# Patient Record
Sex: Male | Born: 1975 | Race: White | Hispanic: No | Marital: Single | Smoking: Current every day smoker
Health system: Southern US, Community
[De-identification: ages and names within clinical notes are randomized; demographics above are authoritative.]

## PROBLEM LIST (undated history)

## (undated) DIAGNOSIS — S83519A Sprain of anterior cruciate ligament of unspecified knee, initial encounter: Secondary | ICD-10-CM

## (undated) DIAGNOSIS — M549 Dorsalgia, unspecified: Secondary | ICD-10-CM

## (undated) DIAGNOSIS — F329 Major depressive disorder, single episode, unspecified: Secondary | ICD-10-CM

## (undated) DIAGNOSIS — S82899A Other fracture of unspecified lower leg, initial encounter for closed fracture: Secondary | ICD-10-CM

## (undated) DIAGNOSIS — R0600 Dyspnea, unspecified: Secondary | ICD-10-CM

## (undated) DIAGNOSIS — F319 Bipolar disorder, unspecified: Secondary | ICD-10-CM

## (undated) DIAGNOSIS — G8929 Other chronic pain: Secondary | ICD-10-CM

## (undated) DIAGNOSIS — R7303 Prediabetes: Secondary | ICD-10-CM

## (undated) DIAGNOSIS — F32A Depression, unspecified: Secondary | ICD-10-CM

## (undated) DIAGNOSIS — F209 Schizophrenia, unspecified: Secondary | ICD-10-CM

## (undated) DIAGNOSIS — F419 Anxiety disorder, unspecified: Secondary | ICD-10-CM

## (undated) DIAGNOSIS — Z87442 Personal history of urinary calculi: Secondary | ICD-10-CM

## (undated) HISTORY — PX: KNEE SURGERY: SHX244

## (undated) HISTORY — PX: MULTIPLE TOOTH EXTRACTIONS: SHX2053

## (undated) HISTORY — PX: VASECTOMY: SHX75

## (undated) HISTORY — DX: Other fracture of unspecified lower leg, initial encounter for closed fracture: S82.899A

---

## 2001-08-09 ENCOUNTER — Emergency Department (HOSPITAL_COMMUNITY): Admission: EM | Admit: 2001-08-09 | Discharge: 2001-08-09 | Payer: Self-pay | Admitting: Emergency Medicine

## 2001-08-09 ENCOUNTER — Encounter: Payer: Self-pay | Admitting: Emergency Medicine

## 2001-08-20 ENCOUNTER — Encounter (HOSPITAL_COMMUNITY): Admission: RE | Admit: 2001-08-20 | Discharge: 2001-09-19 | Payer: Self-pay | Admitting: Preventative Medicine

## 2001-12-08 ENCOUNTER — Emergency Department (HOSPITAL_COMMUNITY): Admission: EM | Admit: 2001-12-08 | Discharge: 2001-12-08 | Payer: Self-pay | Admitting: *Deleted

## 2002-07-23 ENCOUNTER — Emergency Department (HOSPITAL_COMMUNITY): Admission: EM | Admit: 2002-07-23 | Discharge: 2002-07-23 | Payer: Self-pay | Admitting: *Deleted

## 2003-04-21 ENCOUNTER — Emergency Department (HOSPITAL_COMMUNITY): Admission: EM | Admit: 2003-04-21 | Discharge: 2003-04-21 | Payer: Self-pay | Admitting: Emergency Medicine

## 2003-04-29 ENCOUNTER — Ambulatory Visit (HOSPITAL_COMMUNITY): Admission: RE | Admit: 2003-04-29 | Discharge: 2003-04-29 | Payer: Self-pay | Admitting: Orthopedic Surgery

## 2005-05-27 ENCOUNTER — Ambulatory Visit (HOSPITAL_COMMUNITY): Admission: RE | Admit: 2005-05-27 | Discharge: 2005-05-27 | Payer: Self-pay | Admitting: Family Medicine

## 2008-05-14 ENCOUNTER — Emergency Department (HOSPITAL_COMMUNITY): Admission: EM | Admit: 2008-05-14 | Discharge: 2008-05-14 | Payer: Self-pay | Admitting: Emergency Medicine

## 2008-11-16 ENCOUNTER — Emergency Department (HOSPITAL_COMMUNITY): Admission: EM | Admit: 2008-11-16 | Discharge: 2008-11-16 | Payer: Self-pay | Admitting: Emergency Medicine

## 2009-02-05 ENCOUNTER — Emergency Department (HOSPITAL_COMMUNITY): Admission: EM | Admit: 2009-02-05 | Discharge: 2009-02-05 | Payer: Self-pay | Admitting: Emergency Medicine

## 2009-04-29 ENCOUNTER — Emergency Department (HOSPITAL_COMMUNITY): Admission: EM | Admit: 2009-04-29 | Discharge: 2009-04-29 | Payer: Self-pay | Admitting: Emergency Medicine

## 2009-05-03 ENCOUNTER — Emergency Department (HOSPITAL_COMMUNITY): Admission: EM | Admit: 2009-05-03 | Discharge: 2009-05-04 | Payer: Self-pay | Admitting: Emergency Medicine

## 2009-08-25 ENCOUNTER — Emergency Department (HOSPITAL_COMMUNITY): Admission: EM | Admit: 2009-08-25 | Discharge: 2009-08-26 | Payer: Self-pay | Admitting: Emergency Medicine

## 2009-11-09 ENCOUNTER — Emergency Department (HOSPITAL_COMMUNITY): Admission: EM | Admit: 2009-11-09 | Discharge: 2009-11-09 | Payer: Self-pay | Admitting: Family Medicine

## 2009-12-24 ENCOUNTER — Emergency Department (HOSPITAL_COMMUNITY): Admission: EM | Admit: 2009-12-24 | Discharge: 2009-12-13 | Payer: Self-pay | Admitting: Emergency Medicine

## 2010-04-22 LAB — POCT CARDIAC MARKERS
Myoglobin, poc: 60.9 ng/mL (ref 12–200)
Troponin i, poc: <0.05 ng/mL (ref 0.00–0.09)

## 2010-04-22 LAB — BASIC METABOLIC PANEL
BUN: 16 mg/dL (ref 6–23)
CO2: 28 mEq/L (ref 19–32)
Chloride: 107 mEq/L (ref 96–112)
Creatinine, Ser: 0.75 mg/dL (ref 0.4–1.5)
GFR calc Af Amer: >60 mL/min (ref >60)
Potassium: 3.6 mEq/L (ref 3.5–5.1)

## 2010-04-28 LAB — URINALYSIS, ROUTINE W REFLEX MICROSCOPIC
Bilirubin Urine: NEGATIVE
Glucose, UA: NEGATIVE mg/dL
Ketones, ur: NEGATIVE mg/dL
Leukocytes, UA: NEGATIVE
Nitrite: NEGATIVE
Specific Gravity, Urine: >1.03 — ABNORMAL HIGH (ref 1.005–1.030)
Urobilinogen, UA: 0.2 mg/dL (ref 0.0–1.0)
pH: 6 (ref 5.0–8.0)

## 2010-04-28 LAB — BASIC METABOLIC PANEL WITH GFR
BUN: 15 mg/dL (ref 6–23)
CO2: 25 meq/L (ref 19–32)
Calcium: 9.6 mg/dL (ref 8.4–10.5)
Chloride: 106 meq/L (ref 96–112)
Creatinine, Ser: 0.88 mg/dL (ref 0.4–1.5)
GFR calc non Af Amer: >60 mL/min
Glucose, Bld: 137 mg/dL — ABNORMAL HIGH (ref 70–99)
Potassium: 2.9 meq/L — ABNORMAL LOW (ref 3.5–5.1)
Sodium: 137 meq/L (ref 135–145)

## 2010-04-28 LAB — CBC
HCT: 46.7 % (ref 39.0–52.0)
Hemoglobin: 16 g/dL (ref 13.0–17.0)
MCHC: 34.3 g/dL (ref 30.0–36.0)
MCV: 92.5 fL (ref 78.0–100.0)
Platelets: 277 K/uL (ref 150–400)
RBC: 5.05 MIL/uL (ref 4.22–5.81)
RDW: 13.3 % (ref 11.5–15.5)
WBC: 15.7 K/uL — ABNORMAL HIGH (ref 4.0–10.5)

## 2010-04-28 LAB — DIFFERENTIAL
Basophils Absolute: 0.1 10*3/uL (ref 0.0–0.1)
Basophils Relative: 0 % (ref 0–1)
Lymphocytes Relative: 42 % (ref 12–46)
Monocytes Absolute: 1 10*3/uL (ref 0.1–1.0)
Neutro Abs: 7.7 10*3/uL (ref 1.7–7.7)
Neutrophils Relative %: 49 % (ref 43–77)

## 2010-04-28 LAB — URINE MICROSCOPIC-ADD ON

## 2011-04-16 ENCOUNTER — Encounter (HOSPITAL_COMMUNITY): Payer: Self-pay | Admitting: *Deleted

## 2011-04-16 ENCOUNTER — Emergency Department (HOSPITAL_COMMUNITY): Payer: Medicaid Other

## 2011-04-16 ENCOUNTER — Emergency Department (HOSPITAL_COMMUNITY)
Admission: EM | Admit: 2011-04-16 | Discharge: 2011-04-16 | Disposition: A | Discharge status: Home / Self Care | Payer: Medicaid Other | Attending: Emergency Medicine | Admitting: Emergency Medicine

## 2011-04-16 DIAGNOSIS — R059 Cough, unspecified: Secondary | ICD-10-CM | POA: Insufficient documentation

## 2011-04-16 DIAGNOSIS — F319 Bipolar disorder, unspecified: Secondary | ICD-10-CM | POA: Insufficient documentation

## 2011-04-16 DIAGNOSIS — F17200 Nicotine dependence, unspecified, uncomplicated: Secondary | ICD-10-CM | POA: Insufficient documentation

## 2011-04-16 DIAGNOSIS — J209 Acute bronchitis, unspecified: Secondary | ICD-10-CM

## 2011-04-16 DIAGNOSIS — R110 Nausea: Secondary | ICD-10-CM | POA: Insufficient documentation

## 2011-04-16 DIAGNOSIS — F419 Anxiety disorder, unspecified: Secondary | ICD-10-CM

## 2011-04-16 DIAGNOSIS — R05 Cough: Secondary | ICD-10-CM | POA: Insufficient documentation

## 2011-04-16 HISTORY — DX: Depression, unspecified: F32.A

## 2011-04-16 HISTORY — DX: Major depressive disorder, single episode, unspecified: F32.9

## 2011-04-16 HISTORY — DX: Bipolar disorder, unspecified: F31.9

## 2011-04-16 MED ORDER — AZITHROMYCIN 250 MG PO TABS: 250.0000 mg | ORAL_TABLET | Freq: Every day | ORAL | Status: AC

## 2011-04-16 MED ORDER — LORAZEPAM 1 MG PO TABS: 1.0000 mg | ORAL_TABLET | Freq: Four times a day (QID) | ORAL | Status: AC | PRN

## 2011-04-16 NOTE — ED Provider Notes (Signed)
History   This chart was scribed for Geoffery Lyons, MD by Melba Coon. The patient was seen in room APA16A/APA16A and the patient's care was started at 1:03PM.    CSN: [id number]  Arrival date & time 04/16/11  1235   First MD Initiated Contact with Patient 04/16/11 1259      Chief Complaint  Patient presents with  . Nausea  . Cough    (Consider location/radiation/quality/duration/timing/severity/associated sxs/prior treatment) HPI Craig [name] is a 36 y.o. male who presents to the Emergency Department complaining of persistent moderate to severe nausea with associated productive cough with an onset 2 days ago. Productive cough has light brown phlegm. Around onset, pt states he left his wife the same day; no suicidal or homicidal ideation. Sore throat, decreased appetite, and disturbed sleep present. No HA, fever, neck pain, back pain, CP, SOB, abd pain, diarrhea, hematemesis, or extremity pain, weakness, numbness, or tingling. Pt has a Hx of depression and bipolar disorder and takes depression meds (lithium, Latuda) which does not help. Pt also takes Crestor for his cholesterol levels. Allergic to penicillins. No other pertinent medical symptoms.  Past Medical History  Diagnosis Date  . Bipolar 1 disorder   . Depression     History reviewed. No pertinent past surgical history.  History reviewed. No pertinent family history.  History  Substance Use Topics  . Smoking status: Current Everyday Smoker -- 1.0 packs/day    Types: Cigarettes  . Smokeless tobacco: Not on file  . Alcohol Use: No      Review of Systems 10 Systems reviewed and all are negative for acute change except as noted in the HPI.   Allergies  Penicillins  Home Medications  No current outpatient prescriptions on file.  BP 105/80  Pulse 94  Temp(Src) 97.3 F (36.3 C) (Oral)  Resp 20  Ht 5\' 5"  (1.651 m)  Wt 183 lb (83.008 kg)  BMI 30.45 kg/m2  SpO2 100%  Physical Exam  Nursing note and  vitals reviewed. Constitutional: He is oriented to person, place, and time. He appears well-developed and well-nourished.       Awake, alert, nontoxic appearance.  HENT:  Head: Normocephalic and atraumatic.  Eyes: EOM are normal. Pupils are equal, round, and reactive to light. Right eye exhibits no discharge. Left eye exhibits no discharge.  Neck: Normal range of motion. Neck supple.  Cardiovascular: Normal rate, regular rhythm and normal heart sounds.   No murmur heard. Pulmonary/Chest: Effort normal and breath sounds normal. He exhibits no tenderness.  Abdominal: Soft. There is no tenderness. There is no rebound and no guarding.  Musculoskeletal: Normal range of motion. He exhibits no tenderness.       Baseline ROM, no obvious new focal weakness.  Neurological: He is alert and oriented to person, place, and time.       Mental status and motor strength appears baseline for patient and situation.  Skin: Skin is warm and dry. No rash noted.  Psychiatric: He has a normal mood and affect. His behavior is normal.    ED Course  Procedures (including critical care time)  DIAGNOSTIC STUDIES: Oxygen Saturation is 100% on room air, normal by my interpretation.    COORDINATION OF CARE:  1:19PM - EDMD will order abd CT for the pt.   Labs Reviewed - No data to display No results found.   No diagnosis found.    MDM  This appears to be bronchitis and will treat with zmax.  He admits to feeling  depressed but denies SI or HI.  He was offered consultation with act team which he declines.  He wants something for his nerves.    I personally performed the services described in this documentation, which was scribed in my presence. The recorded information has been reviewed and considered.          Geoffery Lyons, MD 04/16/11 [phone]

## 2011-04-16 NOTE — Discharge Instructions (Signed)

## 2011-04-16 NOTE — ED Notes (Signed)
For past 2 days pt c/o nausea and cough, also admits to leaving wife, denies SI or HI, hx of bipolar dx

## 2011-07-14 ENCOUNTER — Emergency Department (HOSPITAL_COMMUNITY)
Admission: EM | Admit: 2011-07-14 | Discharge: 2011-07-14 | Disposition: A | Discharge status: Home / Self Care | Payer: Medicaid Other | Attending: Emergency Medicine | Admitting: Emergency Medicine

## 2011-07-14 ENCOUNTER — Encounter (HOSPITAL_COMMUNITY): Payer: Self-pay | Admitting: *Deleted

## 2011-07-14 DIAGNOSIS — F319 Bipolar disorder, unspecified: Secondary | ICD-10-CM | POA: Insufficient documentation

## 2011-07-14 DIAGNOSIS — F411 Generalized anxiety disorder: Secondary | ICD-10-CM | POA: Insufficient documentation

## 2011-07-14 DIAGNOSIS — S60569A Insect bite (nonvenomous) of unspecified hand, initial encounter: Secondary | ICD-10-CM | POA: Insufficient documentation

## 2011-07-14 DIAGNOSIS — F17200 Nicotine dependence, unspecified, uncomplicated: Secondary | ICD-10-CM | POA: Insufficient documentation

## 2011-07-14 DIAGNOSIS — Z880 Allergy status to penicillin: Secondary | ICD-10-CM | POA: Insufficient documentation

## 2011-07-14 DIAGNOSIS — F209 Schizophrenia, unspecified: Secondary | ICD-10-CM | POA: Insufficient documentation

## 2011-07-14 DIAGNOSIS — W57XXXA Bitten or stung by nonvenomous insect and other nonvenomous arthropods, initial encounter: Secondary | ICD-10-CM

## 2011-07-14 HISTORY — DX: Anxiety disorder, unspecified: F41.9

## 2011-07-14 HISTORY — DX: Schizophrenia, unspecified: F20.9

## 2011-07-14 MED ORDER — FAMOTIDINE 20 MG PO TABS
20.0000 mg | ORAL_TABLET | Freq: Once | ORAL | Status: AC
  Administered 2011-07-14: 20 mg via ORAL
  Filled 2011-07-14: qty 1

## 2011-07-14 MED ORDER — TETANUS-DIPHTH-ACELL PERTUSSIS 5-2.5-18.5 LF-MCG/0.5 IM SUSP
0.5000 mL | Freq: Once | INTRAMUSCULAR | Status: AC
  Administered 2011-07-14: 0.5 mL via INTRAMUSCULAR
  Filled 2011-07-14: qty 0.5

## 2011-07-14 MED ORDER — HYDROCODONE-ACETAMINOPHEN 5-325 MG PO TABS
1.0000 | ORAL_TABLET | Freq: Once | ORAL | Status: AC
  Administered 2011-07-14: 1 via ORAL
  Filled 2011-07-14: qty 1

## 2011-07-14 MED ORDER — PREDNISONE 20 MG PO TABS
60.0000 mg | ORAL_TABLET | Freq: Once | ORAL | Status: AC
  Administered 2011-07-14: 60 mg via ORAL
  Filled 2011-07-14: qty 3

## 2011-07-14 MED ORDER — DIPHENHYDRAMINE HCL 25 MG PO CAPS
25.0000 mg | ORAL_CAPSULE | Freq: Once | ORAL | Status: AC
  Administered 2011-07-14: 25 mg via ORAL
  Filled 2011-07-14: qty 1

## 2011-07-14 NOTE — ED Notes (Signed)
Pt stated "I crawled under my trailer to get my dog, put my hand down on some dirt and felt something bite the heel of my left hand". Pt did not see what bit/stung him. No evident entry mark, lt hand is swollen and red from behind thumb extending to wrist.

## 2011-07-14 NOTE — ED Notes (Signed)
Discharge instructions reviewed with pt, questions answered. Pt verbalized understanding.  

## 2011-07-14 NOTE — Discharge Instructions (Signed)
Keep the hand elevated as much as possible. You may use benadryl for itching. Apply ice for swelling. Use tylenol or ibuprofen for discomfort.    Insect Bite Mosquitoes, flies, fleas, bedbugs, and other insects can bite. Insect bites are different from insect stings. The bite may be red, puffy (swollen), and itchy for 2 to 4 days. Most bites get better on their own. HOME CARE   Do not scratch the bite.   Keep the bite clean and dry. Wash the bite with soap and water.   Put ice on the bite.   Put ice in a plastic bag.   Place a towel between your skin and the bag.   Leave the ice on for 20 minutes, 4 times a day. Do this for the first 2 to 3 days, or as told by your doctor.   You may use medicated lotions or creams to lessen itching as told by your doctor.   Only take medicines as told by your doctor.   If you are given medicines (antibiotics), take them as told. Finish them even if you start to feel better.  You may need a tetanus shot if:  You cannot remember when you had your last tetanus shot.   You have never had a tetanus shot.   The injury broke your skin.  If you need a tetanus shot and you choose not to have one, you may get tetanus. Sickness from tetanus can be serious. GET HELP RIGHT AWAY IF:   You have more pain, redness, or puffiness.   You see a red line on the skin coming from the bite.   You have a fever.   You have joint pain.   You have a headache or neck pain.   You feel weak.   You have a rash.   You have chest pain, or you are short of breath.   You have belly (abdominal) pain.   You feel sick to your stomach (nauseous) or throw up (vomit).   You feel very tired or sleepy.  MAKE SURE YOU:   Understand these instructions.   Will watch your condition.   Will get help right away if you are not doing well or get worse.  Document Released: 01/01/2000 Document Revised: 12/23/2010 Document Reviewed: 08/04/2010 [facility] Patient Information  2012 [place], [place].

## 2011-07-14 NOTE — ED Provider Notes (Signed)
History     CSN: [id number]  Arrival date & time 07/14/11  0203   First MD Initiated Contact with Patient 07/14/11 0221      Chief Complaint  Patient presents with  . Insect Bite    (Consider location/radiation/quality/duration/timing/severity/associated sxs/prior treatment) HPI  Craig [name] is a 36 y.o. male who presents to the Emergency Department complaining of an insect bite to his left hand he sustained when he crawled under his trailer to get his dog this evening. He felt the sting but did not see the insect. Now with pain to the base of his thumb extending to his wrist.He has taken no medicines.  PCP Dr. Janna Arch  Past Medical History  Diagnosis Date  . Bipolar 1 disorder   . Depression   . Schizophrenia   . Anxiety     History reviewed. No pertinent past surgical history.  History reviewed. No pertinent family history.  History  Substance Use Topics  . Smoking status: Current Everyday Smoker -- 1.0 packs/day    Types: Cigarettes  . Smokeless tobacco: Not on file  . Alcohol Use: No      Review of Systems  Constitutional: Negative for fever.       10 Systems reviewed and are negative for acute change except as noted in the HPI.  HENT: Negative for congestion.   Eyes: Negative for discharge and redness.  Respiratory: Negative for cough and shortness of breath.   Cardiovascular: Negative for chest pain.  Gastrointestinal: Negative for vomiting and abdominal pain.  Musculoskeletal: Negative for back pain.       Hand pain  Skin: Negative for rash.  Neurological: Negative for syncope, numbness and headaches.  Psychiatric/Behavioral:       No behavior change.    Allergies  Penicillins  Home Medications   Current Outpatient Rx  Name Route Sig Dispense Refill  . BUSPAR PO Oral Take 1 tablet by mouth daily. Unknown strength    . LITHIUM CARBONATE 300 MG PO CAPS Oral Take 600 mg by mouth 2 (two) times daily.    [name] LATUDA PO Oral Take 1 tablet by  mouth daily. Unknown strength    . CRESTOR PO Oral Take 1 tablet by mouth daily. Unknown strength      BP 124/87  Pulse 84  Temp 98.3 F (36.8 C) (Oral)  Resp 17  Ht 5\' 5"  (1.651 m)  Wt 180 lb (81.647 kg)  BMI 29.95 kg/m2  SpO2 96%  Physical Exam  Nursing note and vitals reviewed. Constitutional:       Awake, alert, nontoxic appearance.  HENT:  Head: Atraumatic.  Eyes: Right eye exhibits no discharge. Left eye exhibits no discharge.  Neck: Neck supple.  Pulmonary/Chest: Effort normal. He exhibits no tenderness.  Abdominal: Soft. There is no tenderness. There is no rebound.  Musculoskeletal: He exhibits no tenderness.       Baseline ROM, no obvious new focal weakness.  Neurological:       Mental status and motor strength appears baseline for patient and situation.  Skin: No rash noted.       Left hand with mild erythema and swelling to thumb extending to wrist. Tender to touch. No obvious bite mark or puncture wound.Pulses 2+, cap refill brisk.  Psychiatric: He has a normal mood and affect.    ED Course  Procedures (including critical care time)      MDM  Patient with an insect bite to the left hand now with erythema and swelling.  Given benadryl, pepcid, prednisone, tetanus.Pt stable in ED with no significant deterioration in condition.The patient appears reasonably screened and/or stabilized for discharge and I doubt any other medical condition or other [facility] requiring further screening, evaluation, or treatment in the ED at this time prior to discharge.  MDM Reviewed: nursing note and vitals           Nicoletta Dress. Colon Cadle, MD 07/14/11 [phone]

## 2012-06-13 ENCOUNTER — Other Ambulatory Visit (HOSPITAL_COMMUNITY)
Admission: RE | Admit: 2012-06-13 | Discharge: 2012-06-13 | Disposition: A | Discharge status: Home / Self Care | Payer: Medicare Other | Source: Ambulatory Visit | Attending: Urology | Admitting: Urology

## 2012-06-13 DIAGNOSIS — Z9852 Vasectomy status: Secondary | ICD-10-CM | POA: Insufficient documentation

## 2012-07-06 ENCOUNTER — Encounter (HOSPITAL_COMMUNITY): Payer: Self-pay | Admitting: *Deleted

## 2012-07-06 ENCOUNTER — Emergency Department (HOSPITAL_COMMUNITY): Payer: Medicare Other

## 2012-07-06 ENCOUNTER — Emergency Department (HOSPITAL_COMMUNITY)
Admission: EM | Admit: 2012-07-06 | Discharge: 2012-07-06 | Disposition: A | Discharge status: Home / Self Care | Payer: Medicare Other | Attending: Emergency Medicine | Admitting: Emergency Medicine

## 2012-07-06 DIAGNOSIS — H53149 Visual discomfort, unspecified: Secondary | ICD-10-CM | POA: Insufficient documentation

## 2012-07-06 DIAGNOSIS — M5380 Other specified dorsopathies, site unspecified: Secondary | ICD-10-CM | POA: Insufficient documentation

## 2012-07-06 DIAGNOSIS — Z79899 Other long term (current) drug therapy: Secondary | ICD-10-CM | POA: Insufficient documentation

## 2012-07-06 DIAGNOSIS — F17200 Nicotine dependence, unspecified, uncomplicated: Secondary | ICD-10-CM | POA: Insufficient documentation

## 2012-07-06 DIAGNOSIS — M62838 Other muscle spasm: Secondary | ICD-10-CM

## 2012-07-06 DIAGNOSIS — R079 Chest pain, unspecified: Secondary | ICD-10-CM | POA: Insufficient documentation

## 2012-07-06 DIAGNOSIS — F411 Generalized anxiety disorder: Secondary | ICD-10-CM | POA: Insufficient documentation

## 2012-07-06 DIAGNOSIS — F319 Bipolar disorder, unspecified: Secondary | ICD-10-CM | POA: Insufficient documentation

## 2012-07-06 DIAGNOSIS — Z8659 Personal history of other mental and behavioral disorders: Secondary | ICD-10-CM | POA: Insufficient documentation

## 2012-07-06 DIAGNOSIS — R51 Headache: Secondary | ICD-10-CM | POA: Insufficient documentation

## 2012-07-06 LAB — CBC WITH DIFFERENTIAL/PLATELET
Basophils Absolute: 0 10*3/uL (ref 0.0–0.1)
Basophils Relative: 0 % (ref 0–1)
Hemoglobin: 15.7 g/dL (ref 13.0–17.0)
Lymphocytes Relative: 30 % (ref 12–46)
MCHC: 34.4 g/dL (ref 30.0–36.0)
Monocytes Relative: 8 % (ref 3–12)
Neutro Abs: 6.8 10*3/uL (ref 1.7–7.7)
Neutrophils Relative %: 59 % (ref 43–77)
RBC: 5.01 MIL/uL (ref 4.22–5.81)
WBC: 11.4 10*3/uL — ABNORMAL HIGH (ref 4.0–10.5)

## 2012-07-06 LAB — BASIC METABOLIC PANEL
BUN: 14 mg/dL (ref 6–23)
CO2: 26 mEq/L (ref 19–32)
Chloride: 104 mEq/L (ref 96–112)
GFR calc Af Amer: >90 mL/min (ref >90)
Potassium: 3.6 mEq/L (ref 3.5–5.1)

## 2012-07-06 LAB — LITHIUM LEVEL: Lithium Lvl: <0.25 mEq/L — ABNORMAL LOW (ref 0.80–1.40)

## 2012-07-06 MED ORDER — SODIUM CHLORIDE 0.9 % IV SOLN
1000.0000 mL | INTRAVENOUS | Status: DC
  Administered 2012-07-06: 1000 mL via INTRAVENOUS

## 2012-07-06 MED ORDER — SODIUM CHLORIDE 0.9 % IV SOLN
1000.0000 mL | Freq: Once | INTRAVENOUS | Status: AC
  Administered 2012-07-06: 1000 mL via INTRAVENOUS

## 2012-07-06 MED ORDER — METOCLOPRAMIDE HCL 5 MG/ML IJ SOLN
10.0000 mg | Freq: Once | INTRAMUSCULAR | Status: AC
  Administered 2012-07-06: 10 mg via INTRAVENOUS
  Filled 2012-07-06: qty 2

## 2012-07-06 MED ORDER — KETOROLAC TROMETHAMINE 30 MG/ML IJ SOLN
30.0000 mg | Freq: Once | INTRAMUSCULAR | Status: AC
  Administered 2012-07-06: 30 mg via INTRAVENOUS
  Filled 2012-07-06: qty 1

## 2012-07-06 MED ORDER — DIPHENHYDRAMINE HCL 50 MG/ML IJ SOLN
25.0000 mg | Freq: Once | INTRAMUSCULAR | Status: AC
  Administered 2012-07-06: 25 mg via INTRAVENOUS
  Filled 2012-07-06: qty 1

## 2012-07-06 MED ORDER — MAGNESIUM SULFATE 40 MG/ML IJ SOLN
2.0000 g | Freq: Once | INTRAMUSCULAR | Status: AC
  Administered 2012-07-06: 2 g via INTRAVENOUS
  Filled 2012-07-06: qty 50

## 2012-07-06 MED ORDER — METHOCARBAMOL 500 MG PO TABS: 1000.0000 mg | ORAL_TABLET | Freq: Four times a day (QID) | ORAL | Status: DC | PRN

## 2012-07-06 NOTE — ED Notes (Signed)
Headache x 4 days. New onset. Denies hx of headaches. States dizziness at times.

## 2012-07-06 NOTE — ED Provider Notes (Signed)
History     CSN: [id number]  Arrival date & time 07/06/12  1743   First MD Initiated Contact with Patient 07/06/12 1807      Chief Complaint  Patient presents with  . Headache    (Consider location/radiation/quality/duration/timing/severity/associated sxs/prior treatment) HPI Patient reports about 4 days ago he woke up from sleep about 3 AM with pain that initially was in the front part of his head and then radiated to the back part of his head. He states the headache has been constant but waxes and wanes. He describes the headache as throbbing. He states nothing makes it feel worse and nothing makes it feel better, however he does admit to photophobia and noise sensitivity. He states he took a hydrocodone or Percocet and ibuprofen for pain without relief. He states he's never had a headache like this before. He denies blurred vision, nausea, vomiting, or fever. He states he feels lightheaded like he is going to pass out. He also relates he has had chest pain all day today that lasts 30-60 minutes at the time and he describes as a dull pain and sometimes sharp. He denies shortness of breath or sweatiness. He relates his current headache as an 8/10 and at its worst it was an 8/10. Pt reports he has been under stress b/o bills.   PCP Dr Loney Hering  Past Medical History  Diagnosis Date  . Bipolar 1 disorder   . Depression   . Schizophrenia   . Anxiety     History reviewed. No pertinent past surgical history.  No family history on file.  History  Substance Use Topics  . Smoking status: Current Every Day Smoker -- 1.00 packs/day    Types: Cigarettes  . Smokeless tobacco: Not on file  . Alcohol Use: No   Lives at home Lives with wife and daughters   Review of Systems  All other systems reviewed and are negative.    Allergies  Penicillins  Home Medications   Current Outpatient Rx  Name  Route  Sig  Dispense  Refill  . albuterol (PROAIR HFA) 108 (90 BASE) MCG/ACT inhaler  Inhalation   Inhale 2 puffs into the lungs 4 (four) times daily as needed for wheezing or shortness of breath.         . chlorproMAZINE (THORAZINE) 50 MG tablet   Oral   Take 50 mg by mouth 3 (three) times daily.         [name] HYDROcodone-acetaminophen (NORCO/VICODIN) 5-325 MG per tablet   Oral   Take 1 tablet by mouth every 6 (six) hours as needed for pain.         [name] lithium carbonate (LITHOBID) 300 MG CR tablet   Oral   Take 600 mg by mouth 2 (two) times daily.           BP 119/73  Pulse 64  Temp(Src) 98 F (36.7 C) (Oral)  Resp 16  Ht 5\' 5"  (1.651 m)  Wt 190 lb (86.183 kg)  BMI 31.62 kg/m2  SpO2 98%  Vital signs normal    Physical Exam  Nursing note and vitals reviewed. Constitutional: He is oriented to person, place, and time. He appears well-developed and well-nourished.  Non-toxic appearance. He does not appear ill. No distress.  Patient moves his head freely during normal conversation  HENT:  Head: Normocephalic and atraumatic.  Right Ear: External ear normal.  Left Ear: External ear normal.  Nose: Nose normal. No mucosal edema or rhinorrhea.  Mouth/Throat: Oropharynx is  clear and moist and mucous membranes are normal. No dental abscesses or edematous.  Eyes: Conjunctivae and EOM are normal. Pupils are equal, round, and reactive to light.  Neck: Normal range of motion and full passive range of motion without pain. Neck supple.    Tender in the right paraspinous muscles, the pain is worse when he flexes his neck and when he looks to the left, he has no worsening of pain looking to the right or looking upward  Cardiovascular: Normal rate, regular rhythm and normal heart sounds.  Exam reveals no gallop and no friction rub.   No murmur heard. Pulmonary/Chest: Effort normal and breath sounds normal. No respiratory distress. He has no wheezes. He has no rhonchi. He has no rales. He exhibits no tenderness and no crepitus.  Abdominal: Soft. Normal appearance and  bowel sounds are normal. He exhibits no distension. There is no tenderness. There is no rebound and no guarding.  Musculoskeletal: Normal range of motion. He exhibits no edema and no tenderness.  Moves all extremities well.   Neurological: He is alert and oriented to person, place, and time. He has normal strength. No cranial nerve deficit.  Skin: Skin is warm, dry and intact. No rash noted. No erythema. No pallor.  Psychiatric: He has a normal mood and affect. His speech is normal and behavior is normal. His mood appears not anxious.    ED Course  Procedures (including critical care time)  Medications  0.9 %  sodium chloride infusion (0 mLs Intravenous Stopped 07/06/12 2007)    Followed by  0.9 %  sodium chloride infusion (1,000 mLs Intravenous New Bag/Given 07/06/12 2007)  ketorolac (TORADOL) 30 MG/ML injection 30 mg (30 mg Intravenous Given 07/06/12 1859)  metoCLOPramide (REGLAN) injection 10 mg (10 mg Intravenous Given 07/06/12 1858)  diphenhydrAMINE (BENADRYL) injection 25 mg (25 mg Intravenous Given 07/06/12 1856)  ketorolac (TORADOL) 30 MG/ML injection 30 mg (30 mg Intravenous Given 07/06/12 2002)  magnesium sulfate IVPB 2 g 50 mL (0 g Intravenous Stopped 07/06/12 2029)    Recheck at 8 PM patient states his headache is better but still present. He was given additional medications  Recheck prior to discharge patient sitting up on the stretcher, he states his headache is gone and he feels ready to go home.   Results for orders placed during the hospital encounter of 07/06/12  CBC WITH DIFFERENTIAL      Result Value Range   WBC 11.4 (*) 4.0 - 10.5 K/uL   RBC 5.01  4.22 - 5.81 MIL/uL   Hemoglobin 15.7  13.0 - 17.0 g/dL   HCT 16.1  09.6 - 04.5 %   MCV 91.0  78.0 - 100.0 fL   MCH 31.3  26.0 - 34.0 pg   MCHC 34.4  30.0 - 36.0 g/dL   RDW 40.9  81.1 - 91.4 %   Platelets 279  150 - 400 K/uL   Neutrophils Relative % 59  43 - 77 %   Neutro Abs 6.8  1.7 - 7.7 K/uL   Lymphocytes Relative  30  12 - 46 %   Lymphs Abs 3.5  0.7 - 4.0 K/uL   Monocytes Relative 8  3 - 12 %   Monocytes Absolute 0.9  0.1 - 1.0 K/uL   Eosinophils Relative 2  0 - 5 %   Eosinophils Absolute 0.3  0.0 - 0.7 K/uL   Basophils Relative 0  0 - 1 %   Basophils Absolute 0.0  0.0 - 0.1 K/uL  BASIC METABOLIC PANEL      Result Value Range   Sodium 141  135 - 145 mEq/L   Potassium 3.6  3.5 - 5.1 mEq/L   Chloride 104  96 - 112 mEq/L   CO2 26  19 - 32 mEq/L   Glucose, Bld 98  70 - 99 mg/dL   BUN 14  6 - 23 mg/dL   Creatinine, Ser 1.61  0.50 - 1.35 mg/dL   Calcium 9.7  8.4 - 09.6 mg/dL   GFR calc non Af Amer >90  >90 mL/min   GFR calc Af Amer >90  >90 mL/min  LITHIUM LEVEL      Result Value Range   Lithium Lvl <0.25 (*) 0.80 - 1.40 mEq/L  POCT I-STAT TROPONIN I      Result Value Range   Troponin i, poc 0.00  0.00 - 0.08 ng/mL   Comment 3            Laboratory interpretation all normal except except subtherapeutic lithium level   Dg Chest 2 View  07/06/2012   *RADIOLOGY REPORT*  Clinical Data: Chest pain, dizziness and weakness.  CHEST - 2 VIEW  Comparison: 04/16/2011.  Findings: The heart is upper limits of normal and stable.  The mediastinal and hilar contours are normal.  The lungs are clear. No pleural effusion.  The bony thorax is intact.  IMPRESSION: Normal chest x-ray.   Original Report Authenticated By: Rudie Meyer, M.D.   Ct Head Wo Contrast  07/06/2012   *RADIOLOGY REPORT*  Clinical Data: Headache for 4 days  CT HEAD WITHOUT CONTRAST  Technique:  Contiguous axial images were obtained from the base of the skull through the vertex without contrast.  Comparison: Head CT 09/19/2010  Findings: No acute intracranial hemorrhage.  No focal mass lesion. No CT evidence of acute infarction.   No midline shift or mass effect.  No hydrocephalus.  Basilar cisterns are patent.  There is dural thickening along the posterior interhemispheric fissure posterior to the corpus callosal on the right which is stable  compared to  9/12. Paranasal sinuses and mastoid air cells are clear.  Orbits are normal.  IMPRESSION: No acute intracranial findings.  No change from prior.   Original Report Authenticated By: Genevive Bi, M.D.      Date: 07/06/2012  Rate: 60  Rhythm: normal sinus rhythm  QRS Axis: normal  Intervals: PR prolonged  ST/T Wave abnormalities: normal  Conduction Disutrbances:IRBBB  Narrative Interpretation:   Old EKG Reviewed: unchanged from 11/16/2008     1. Headache   2. Spasm of cervical paraspinous muscle    New Prescriptions   METHOCARBAMOL (ROBAXIN) 500 MG TABLET    Take 2 tablets (1,000 mg total) by mouth 4 (four) times daily as needed (muscle soreness in neck).   Plan discharge  Devoria Albe, MD, FACEP    MDM  patient has headache that is most consistent with migraine-type headache. Although he complains of neck pain he has no nuchal rigidity or fever. I do not suspect subarachnoid hemorrhage or meningitis. His head CT is normal.           Ward Givens, MD 07/06/12 2115

## 2012-07-06 NOTE — ED Notes (Signed)
Pt c/o headache x4 days. Pt states headache woke him up from his sleep at onset. Pt denies NVD but does report intermittent dizziness.

## 2012-07-14 ENCOUNTER — Emergency Department (HOSPITAL_COMMUNITY)
Admission: EM | Admit: 2012-07-14 | Discharge: 2012-07-14 | Disposition: A | Discharge status: Home / Self Care | Payer: Medicare Other | Attending: Emergency Medicine | Admitting: Emergency Medicine

## 2012-07-14 ENCOUNTER — Encounter (HOSPITAL_COMMUNITY): Payer: Self-pay | Admitting: *Deleted

## 2012-07-14 DIAGNOSIS — M549 Dorsalgia, unspecified: Secondary | ICD-10-CM | POA: Insufficient documentation

## 2012-07-14 DIAGNOSIS — J069 Acute upper respiratory infection, unspecified: Secondary | ICD-10-CM | POA: Insufficient documentation

## 2012-07-14 DIAGNOSIS — F411 Generalized anxiety disorder: Secondary | ICD-10-CM | POA: Insufficient documentation

## 2012-07-14 DIAGNOSIS — R059 Cough, unspecified: Secondary | ICD-10-CM | POA: Insufficient documentation

## 2012-07-14 DIAGNOSIS — J029 Acute pharyngitis, unspecified: Secondary | ICD-10-CM

## 2012-07-14 DIAGNOSIS — R51 Headache: Secondary | ICD-10-CM | POA: Insufficient documentation

## 2012-07-14 DIAGNOSIS — F17200 Nicotine dependence, unspecified, uncomplicated: Secondary | ICD-10-CM | POA: Insufficient documentation

## 2012-07-14 DIAGNOSIS — R05 Cough: Secondary | ICD-10-CM | POA: Insufficient documentation

## 2012-07-14 DIAGNOSIS — Z79899 Other long term (current) drug therapy: Secondary | ICD-10-CM | POA: Insufficient documentation

## 2012-07-14 DIAGNOSIS — Z880 Allergy status to penicillin: Secondary | ICD-10-CM | POA: Insufficient documentation

## 2012-07-14 DIAGNOSIS — F209 Schizophrenia, unspecified: Secondary | ICD-10-CM | POA: Insufficient documentation

## 2012-07-14 DIAGNOSIS — F319 Bipolar disorder, unspecified: Secondary | ICD-10-CM | POA: Insufficient documentation

## 2012-07-14 MED ORDER — AZITHROMYCIN 250 MG PO TABS: 250.0000 mg | ORAL_TABLET | Freq: Every day | ORAL | Status: DC

## 2012-07-14 NOTE — ED Notes (Signed)
nad noted prior to dc. Dc instructions reviewed with pt and explained. 1 script given. Ambulated out without difficulty.  

## 2012-07-14 NOTE — ED Provider Notes (Signed)
History    This chart was scribed for Shelda Jakes, MD by Leone Payor, ED Scribe. This patient was seen in room APA14/APA14 and the patient's care was started 7:13 AM.  CSN: [id number] Arrival date & time 07/14/12  4098  First MD Initiated Contact with Patient 07/14/12 0710     Chief Complaint  Patient presents with  . Sore Throat    The history is provided by the patient. No language interpreter was used.    HPI Comments: Craig [name] is a 37 y.o. male who presents to the Emergency Department complaining of a gradual onset, constant sore throat that started a couple days ago. He denies having strep throat in the past but is concerned he may have it now. States 3 family members currently have strep throat. He denies congestion, fever, rash.   Past Medical History  Diagnosis Date  . Bipolar 1 disorder   . Depression   . Schizophrenia   . Anxiety    History reviewed. No pertinent past surgical history. History reviewed. No pertinent family history. History  Substance Use Topics  . Smoking status: Current Every Day Smoker -- 1.00 packs/day    Types: Cigarettes  . Smokeless tobacco: Not on file  . Alcohol Use: No    Review of Systems  Constitutional: Negative for fever.  HENT: Positive for sore throat. Negative for neck pain.   Eyes: Negative for visual disturbance.  Respiratory: Positive for cough. Negative for shortness of breath.   Cardiovascular: Negative for chest pain and leg swelling.  Gastrointestinal: Negative for nausea, vomiting and diarrhea.  Genitourinary: Negative for dysuria.  Musculoskeletal: Positive for back pain. Negative for myalgias and joint swelling.  Skin: Negative for rash.  Neurological: Positive for headaches.  Hematological: Does not bruise/bleed easily.  Psychiatric/Behavioral: Negative for confusion.    Allergies  Penicillins  Home Medications   Current Outpatient Rx  Name  Route  Sig  Dispense  Refill  . albuterol (PROAIR  HFA) 108 (90 BASE) MCG/ACT inhaler   Inhalation   Inhale 2 puffs into the lungs 4 (four) times daily as needed for wheezing or shortness of breath.         [name] azithromycin (ZITHROMAX) 250 MG tablet   Oral   Take 1 tablet (250 mg total) by mouth daily. Take first 2 tablets together, then 1 every day until finished.   6 tablet   0   . chlorproMAZINE (THORAZINE) 50 MG tablet   Oral   Take 50 mg by mouth 3 (three) times daily.         [name] HYDROcodone-acetaminophen (NORCO/VICODIN) 5-325 MG per tablet   Oral   Take 1 tablet by mouth every 6 (six) hours as needed for pain.         [name] lithium carbonate (LITHOBID) 300 MG CR tablet   Oral   Take 600 mg by mouth 2 (two) times daily.         . methocarbamol (ROBAXIN) 500 MG tablet   Oral   Take 2 tablets (1,000 mg total) by mouth 4 (four) times daily as needed (muscle soreness in neck).   60 tablet   0    BP 123/79  Pulse 62  Temp(Src) 97.6 F (36.4 C) (Oral)  Resp 18  Ht 5\' 5"  (1.651 m)  Wt 190 lb (86.183 kg)  BMI 31.62 kg/m2  SpO2 99% Physical Exam  Nursing note and vitals reviewed. Constitutional: He is oriented to person, place, and time. He  appears well-developed and well-nourished. No distress.  HENT:  Head: Normocephalic and atraumatic.  Mouth/Throat: Uvula is midline. Posterior oropharyngeal erythema present. No oropharyngeal exudate.      Eyes: EOM are normal. No scleral icterus.  Neck: Neck supple. No tracheal deviation present.  Cardiovascular: Normal rate, regular rhythm and normal heart sounds.   No murmur heard. Pulmonary/Chest: Effort normal and breath sounds normal. No respiratory distress. He has no wheezes. He has no rales.  Abdominal: Soft. Bowel sounds are normal. There is no tenderness.  Musculoskeletal: Normal range of motion. He exhibits no edema.  Lymphadenopathy:    He has no cervical adenopathy.  Neurological: He is alert and oriented to person, place, and time.  Skin: Skin is warm and dry.   Psychiatric: He has a normal mood and affect. His behavior is normal.    ED Course  Procedures (including critical care time)  DIAGNOSTIC STUDIES: Oxygen Saturation is 99% on RA, normal by my interpretation.    COORDINATION OF CARE: 7:43 AM Discussed treatment plan with pt at bedside and pt agreed to plan.   Labs Reviewed  RAPID STREP SCREEN  CULTURE, GROUP A STREP   Results for orders placed during the hospital encounter of 07/14/12  RAPID STREP SCREEN      Result Value Range   Streptococcus, Group A Screen (Direct) NEGATIVE  NEGATIVE        No results found. 1. URI (upper respiratory infection)   2. Pharyngitis     MDM  Suspect viral uri but family members all with strep with give trial of antibiotic     I personally performed the services described in this documentation, which was scribed in my presence. The recorded information has been reviewed and is accurate.    Shelda Jakes, MD 07/14/12 [phone]

## 2012-07-14 NOTE — ED Notes (Signed)
Pt states that he things he has strep throat.  States "everyone in my house has it."

## 2012-07-16 LAB — CULTURE, GROUP A STREP

## 2012-07-24 ENCOUNTER — Emergency Department (HOSPITAL_COMMUNITY)
Admission: EM | Admit: 2012-07-24 | Discharge: 2012-07-25 | Disposition: A | Discharge status: Home / Self Care | Payer: Medicare Other | Attending: Emergency Medicine | Admitting: Emergency Medicine

## 2012-07-24 ENCOUNTER — Encounter (HOSPITAL_COMMUNITY): Payer: Self-pay

## 2012-07-24 DIAGNOSIS — Z8659 Personal history of other mental and behavioral disorders: Secondary | ICD-10-CM | POA: Insufficient documentation

## 2012-07-24 DIAGNOSIS — Z79899 Other long term (current) drug therapy: Secondary | ICD-10-CM | POA: Insufficient documentation

## 2012-07-24 DIAGNOSIS — F319 Bipolar disorder, unspecified: Secondary | ICD-10-CM | POA: Insufficient documentation

## 2012-07-24 DIAGNOSIS — Z880 Allergy status to penicillin: Secondary | ICD-10-CM | POA: Insufficient documentation

## 2012-07-24 DIAGNOSIS — F329 Major depressive disorder, single episode, unspecified: Secondary | ICD-10-CM

## 2012-07-24 DIAGNOSIS — F17200 Nicotine dependence, unspecified, uncomplicated: Secondary | ICD-10-CM | POA: Insufficient documentation

## 2012-07-24 LAB — CBC WITH DIFFERENTIAL/PLATELET
Basophils Relative: 0 % (ref 0–1)
Eosinophils Relative: 2 % (ref 0–5)
HCT: 46.3 % (ref 39.0–52.0)
Hemoglobin: 16.1 g/dL (ref 13.0–17.0)
MCH: 31.5 pg (ref 26.0–34.0)
MCHC: 34.8 g/dL (ref 30.0–36.0)
MCV: 90.6 fL (ref 78.0–100.0)
Monocytes Absolute: 0.7 10*3/uL (ref 0.1–1.0)
Monocytes Relative: 6 % (ref 3–12)
Neutro Abs: 7.4 10*3/uL (ref 1.7–7.7)
RDW: 13 % (ref 11.5–15.5)

## 2012-07-24 LAB — BASIC METABOLIC PANEL
BUN: 8 mg/dL (ref 6–23)
Calcium: 9.6 mg/dL (ref 8.4–10.5)
Chloride: 105 mEq/L (ref 96–112)
Creatinine, Ser: 0.8 mg/dL (ref 0.50–1.35)
GFR calc Af Amer: >90 mL/min (ref >90)

## 2012-07-24 LAB — RAPID URINE DRUG SCREEN, HOSP PERFORMED
Barbiturates: NOT DETECTED
Benzodiazepines: NOT DETECTED
Cocaine: NOT DETECTED
Opiates: NOT DETECTED
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
## 2012-07-24 NOTE — ED Notes (Signed)
Increasing depression over past few weeks. "I just want to die" no active plan.

## 2012-07-24 NOTE — ED Notes (Signed)
Pt has hx of depression and states symptoms have recently worsened due to arguments with his wife as well as the recent death of his mother. Pt states "I'm just ready to die" however pt denies a plan or attempt. Pt denies HI. Pt drove himself to ER seeking help for his depression.

## 2012-07-25 NOTE — ED Notes (Addendum)
Pt comfortable being discharged, has a longtime childhood  "buddy" who's home he will stay at tonight. States he will f/u with appt already made with [facility].(pt thinks it's for this week 11th). Left in better spirits than upon arrival. Easily ambulatory with all belongings

## 2012-07-25 NOTE — ED Notes (Signed)
Pt awakened for telepsych consult

## 2012-07-25 NOTE — ED Notes (Signed)
telepsych consult is completed, pt ambivalent about possible hospital admission. Currently waiting for written consult to arrive

## 2012-10-15 ENCOUNTER — Encounter (HOSPITAL_COMMUNITY): Payer: Self-pay | Admitting: Emergency Medicine

## 2012-10-15 ENCOUNTER — Emergency Department (HOSPITAL_COMMUNITY)
Admission: EM | Admit: 2012-10-15 | Discharge: 2012-10-15 | Disposition: A | Discharge status: Home / Self Care | Payer: Medicare Other | Attending: Emergency Medicine | Admitting: Emergency Medicine

## 2012-10-15 DIAGNOSIS — M545 Low back pain, unspecified: Secondary | ICD-10-CM | POA: Insufficient documentation

## 2012-10-15 DIAGNOSIS — Z79899 Other long term (current) drug therapy: Secondary | ICD-10-CM | POA: Insufficient documentation

## 2012-10-15 DIAGNOSIS — F17200 Nicotine dependence, unspecified, uncomplicated: Secondary | ICD-10-CM | POA: Insufficient documentation

## 2012-10-15 DIAGNOSIS — Z8659 Personal history of other mental and behavioral disorders: Secondary | ICD-10-CM | POA: Insufficient documentation

## 2012-10-15 DIAGNOSIS — G8929 Other chronic pain: Secondary | ICD-10-CM | POA: Insufficient documentation

## 2012-10-15 DIAGNOSIS — Z880 Allergy status to penicillin: Secondary | ICD-10-CM | POA: Insufficient documentation

## 2012-10-15 DIAGNOSIS — Z792 Long term (current) use of antibiotics: Secondary | ICD-10-CM | POA: Insufficient documentation

## 2012-10-15 HISTORY — DX: Other chronic pain: G89.29

## 2012-10-15 HISTORY — DX: Dorsalgia, unspecified: M54.9

## 2012-10-15 MED ORDER — HYDROCODONE-ACETAMINOPHEN 5-325 MG PO TABS: 2.0000 | ORAL_TABLET | ORAL | Status: DC | PRN

## 2012-10-15 MED ORDER — NAPROXEN 500 MG PO TABS: 500.0000 mg | ORAL_TABLET | Freq: Two times a day (BID) | ORAL | Status: DC

## 2012-10-15 MED ORDER — KETOROLAC TROMETHAMINE 60 MG/2ML IM SOLN
60.0000 mg | Freq: Once | INTRAMUSCULAR | Status: AC
  Administered 2012-10-15: 60 mg via INTRAMUSCULAR
  Filled 2012-10-15: qty 2

## 2012-10-15 NOTE — ED Notes (Signed)
Patient c/o lower back pain x 3 days.  Patient states has history of bulging discs.

## 2012-10-15 NOTE — ED Provider Notes (Signed)
CSN: [id number]     Arrival date & time 10/15/12  0042 History   First MD Initiated Contact with Patient 10/15/12 0051     Chief Complaint  Patient presents with  . Back Pain   (Consider location/radiation/quality/duration/timing/severity/associated sxs/prior Treatment) HPI Comments: 37 year old male with a history of back pain which he states is chronic, has been present for years and which comes and goes. He has seen Dr. Newell Coral in the past for his back pain, the patient states that he was supposed to get an MRI but his insurance would not cover it. His pain went away so he stopped pursuing that. 3 days ago he developed a recurrence of his back pain which is located in the mid lower back, has been constant, is moderate, worse with changing positions and laying down, better when he stands up. This is not associated with numbness or weakness of the legs, urinary retention or incontinence, fevers or history of IV drug use or cancer. He has not used any medications at home prior to arrival.  Patient is a 37 y.o. male presenting with back pain. The history is provided by the patient.  Back Pain Associated symptoms: no numbness and no weakness     Past Medical History  Diagnosis Date  . Bipolar 1 disorder   . Depression   . Schizophrenia   . Anxiety   . Chronic back pain    History reviewed. No pertinent past surgical history. No family history on file. History  Substance Use Topics  . Smoking status: Current Every Day Smoker -- 1.00 packs/day    Types: Cigarettes  . Smokeless tobacco: Not on file  . Alcohol Use: Yes    Review of Systems  Musculoskeletal: Positive for back pain.  Neurological: Negative for weakness and numbness.    Allergies  Penicillins  Home Medications   Current Outpatient Rx  Name  Route  Sig  Dispense  Refill  . chlorproMAZINE (THORAZINE) 50 MG tablet   Oral   Take 50 mg by mouth 3 (three) times daily.         [name] lithium carbonate (LITHOBID) 300 MG  CR tablet   Oral   Take 600 mg by mouth 2 (two) times daily.         [name] albuterol (PROAIR HFA) 108 (90 BASE) MCG/ACT inhaler   Inhalation   Inhale 2 puffs into the lungs 4 (four) times daily as needed for wheezing or shortness of breath.         [name] azithromycin (ZITHROMAX) 250 MG tablet   Oral   Take 1 tablet (250 mg total) by mouth daily. Take first 2 tablets together, then 1 every day until finished.   6 tablet   0   . HYDROcodone-acetaminophen (NORCO/VICODIN) 5-325 MG per tablet   Oral   Take 2 tablets by mouth every 4 (four) hours as needed for pain.   10 tablet   0   . naproxen (NAPROSYN) 500 MG tablet   Oral   Take 1 tablet (500 mg total) by mouth 2 (two) times daily with a meal.   30 tablet   0    BP 98/65  Pulse 73  Temp(Src) 97.8 F (36.6 C) (Oral)  Resp 18  Wt 190 lb (86.183 kg)  BMI 31.62 kg/m2  SpO2 95% Physical Exam  Nursing note and vitals reviewed. Constitutional: He appears well-developed and well-nourished.  HENT:  Head: Normocephalic and atraumatic.  Eyes: Conjunctivae are normal. No scleral icterus.  Cardiovascular:  Normal rate, regular rhythm and intact distal pulses.   Pulmonary/Chest: Effort normal and breath sounds normal.  Abdominal: Soft.  No pulsating masses, no guarding, no tenderness  Musculoskeletal: He exhibits tenderness ( Local tenderness to the lower lumbar spine, no masses, no redness, no paraspinal tenderness).  No spinal tenderness of the cervical, thoracic or lumbar spines  Neurological: He is alert.  Gait is antalgic secondary to low back pain, isolated strength of the bilateral lower extremities is normal, sensation normal, speech normal - normal reflexes at the patellar tendons bilaterally  Skin: Skin is warm and dry. No erythema.    ED Course  Procedures (including critical care time) Labs Review Labs Reviewed - No data to display Imaging Review No results found.  MDM   1. Back pain, lumbosacral    The patient  appears hemodynamically stable, and neurologically stable but has ongoing low back pain. I will refer him back to his neurosurgeon, assuming that his pain does not get any better. In the meantime I will be proactive and treat with parenteral Toradol here, home with Naprosyn and hydrocodone. The patient is in agreement with the plan.  In the absence of pathologic red flags and a reassuring exam the patient does not need an MRI emergently. He has been informed of the indications for return for any emergent MRI.   Meds given in ED:  Medications  ketorolac (TORADOL) injection 60 mg (not administered)    New Prescriptions   HYDROCODONE-ACETAMINOPHEN (NORCO/VICODIN) 5-325 MG PER TABLET    Take 2 tablets by mouth every 4 (four) hours as needed for pain.   NAPROXEN (NAPROSYN) 500 MG TABLET    Take 1 tablet (500 mg total) by mouth 2 (two) times daily with a meal.        Vida Roller, MD 10/15/12 [phone]

## 2012-11-26 ENCOUNTER — Ambulatory Visit (INDEPENDENT_AMBULATORY_CARE_PROVIDER_SITE_OTHER): Payer: Medicare Other | Admitting: Psychiatry

## 2012-11-26 ENCOUNTER — Encounter (HOSPITAL_COMMUNITY): Payer: Self-pay | Admitting: Psychiatry

## 2012-11-26 VITALS — BP 108/70 | Ht 65.0 in | Wt 190.0 lb

## 2012-11-26 DIAGNOSIS — F329 Major depressive disorder, single episode, unspecified: Secondary | ICD-10-CM | POA: Insufficient documentation

## 2012-11-26 DIAGNOSIS — F411 Generalized anxiety disorder: Secondary | ICD-10-CM | POA: Insufficient documentation

## 2012-11-26 DIAGNOSIS — F39 Unspecified mood [affective] disorder: Secondary | ICD-10-CM

## 2012-11-26 DIAGNOSIS — F988 Other specified behavioral and emotional disorders with onset usually occurring in childhood and adolescence: Secondary | ICD-10-CM

## 2012-11-26 MED ORDER — CLONAZEPAM 0.5 MG PO TABS: 0.5000 mg | ORAL_TABLET | Freq: Three times a day (TID) | ORAL | Status: DC | PRN

## 2012-11-26 MED ORDER — LITHIUM CARBONATE ER 300 MG PO TBCR: 600.0000 mg | EXTENDED_RELEASE_TABLET | Freq: Two times a day (BID) | ORAL | Status: DC
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
## 2012-12-13 ENCOUNTER — Ambulatory Visit (HOSPITAL_COMMUNITY)
Admission: RE | Admit: 2012-12-13 | Discharge: 2012-12-13 | Disposition: A | Discharge status: Home / Self Care | Payer: Medicare Other | Attending: Psychiatry | Admitting: Psychiatry

## 2012-12-13 NOTE — BH Assessment (Signed)
Assessment Note  Craig [name] is an 37 y.o. male.   Walked in for help with depression. "I want help but I want to be able to see my kids." He has five children ages, 105, 81, 39, 5, and 9 months. The reasons he identified for his current depressive state is that his mother died one year ago, and his wife has been cheating on him for several years and they have finally separated. He is moving into "a trailer" which will be ready for him in a few days. He is disabled from his back and ankle pain and appears to have difficulty ambulating without pain.  He lives on his disability check. His sister, brother, and friends are supportive to him and he can stay with them until his trailer is ready. Pt denies SI and said, "I have five reasons not to do that and I know that I would go to hell if I killed myself and I want to see mother again." Twenty years ago pt was admitted to [facility] for substance dependence. When he got out his girlfriend had cheated on him and he then had to go to [place] for HI. Since then he has not had a problem with SI/HI or substance dependence. He did drink 2 beers two months ago.   Pt is alert and oriented x4, depressed appearing with blunted affect, occasionally tearful and appearance is unkempt. He consistently denies SI and HI, he is not psychotic or delusional. Spoke with Shelda Jakes NP about pt and she said that he does not meet criteria for admission to inpatient. Pt is willing to attend intensive outpatient therapy. He has a car and can drive himself. Called OP and gave them his address, birth-date and telephone number. Pt was given information about the OP program here and information about suicide prevention. Told pt to call 911 or go to the hospital if he began to feel suicidal. Pt declined to have a physical exam and signed the MSE refusal form.  Escorted pt to the exit.    Axis I: Depressive Disorder NOS Axis II: Deferred Axis III:  Past Medical History   Diagnosis Date  . Bipolar 1 disorder   . Depression   . Schizophrenia   . Anxiety   . Chronic back pain   . Fracture, ankle    Axis IV: other psychosocial or environmental problems and problems related to social environment Axis V: 61-70 mild symptoms  Past Medical History:  Past Medical History  Diagnosis Date  . Bipolar 1 disorder   . Depression   . Schizophrenia   . Anxiety   . Chronic back pain   . Fracture, ankle     Past Surgical History  Procedure Laterality Date  . Vasectomy      Family History:  Family History  Problem Relation Age of Onset  . Alcohol abuse Father     Social History:  reports that he has been smoking Cigarettes.  He has been smoking about 1.00 pack per day. He does not have any smokeless tobacco history on file. He reports that he drinks alcohol. He reports that he does not use illicit drugs.  Additional Social History:     CIWA:   COWS:    Allergies:  Allergies  Allergen Reactions  . Penicillins Swelling and Rash    Home Medications:  (Not in a hospital admission)  OB/GYN Status:  No LMP for male patient.  General Assessment Data Location of Assessment: [facility]  Is this an Initial Assessment or a Re-assessment for this encounter?: Initial Assessment Living Arrangements: Alone Can pt return to current living arrangement?: Yes Admission Status: Voluntary Is patient capable of signing voluntary admission?: Yes Transfer from: Home Referral Source: Self/Family/Friend  Medical Screening Exam [facility] Walk-in ONLY) Medical Exam completed: No Reason for MSE not completed: Patient Refused  [facility] Crisis Care Plan Living Arrangements: Alone  Education Status Is patient currently in school?: No Highest grade of school patient has completed: 10  Risk to self Suicidal Ideation: No Suicidal Intent: No Is patient at risk for suicide?: No Suicidal Plan?: No Access to Means: No What has been your use of drugs/alcohol  within the last 12 months?: 20 years ago Previous Attempts/Gestures: Yes How many times?: 1 (20 years ago) Other Self Harm Risks: none Triggers for Past Attempts: Other (Comment) (girl friend cheated on him) Intentional Self Injurious Behavior: None Family Suicide History: No Recent stressful life event(s): Loss (Comment) (mother died last year and wife unfaitful and left him) Persecutory voices/beliefs?: No Depression: Yes Depression Symptoms: Tearfulness;Feeling worthless/self pity;Loss of interest in usual pleasures Substance abuse history and/or treatment for substance abuse?: No (last use 20 years ago)  Risk to Others Homicidal Ideation: No Thoughts of Harm to Others: No Current Homicidal Intent: No Current Homicidal Plan: No Access to Homicidal Means: No History of harm to others?: No Assessment of Violence: None Noted Does patient have access to weapons?: No Criminal Charges Pending?: No Does patient have a court date: No  Psychosis Hallucinations: None noted Delusions: None noted  Mental Status Report Appear/Hygiene: Body odor;Disheveled Eye Contact: Good Motor Activity: Freedom of movement Speech: Logical/coherent Level of Consciousness: Alert Mood: Depressed;Sad Affect: Blunted;Depressed;Sad Anxiety Level: Minimal Thought Processes: Coherent;Relevant Judgement: Unimpaired Orientation: Person;Place;Time;Situation Obsessive Compulsive Thoughts/Behaviors: None  Cognitive Functioning Concentration: Normal Memory: Recent Intact;Remote Intact IQ: Average Insight: Good Impulse Control: Good Appetite: Fair Sleep: No Change Total Hours of Sleep: 8 Vegetative Symptoms: None  ADLScreening [facility] Assessment Services) Patient's cognitive ability adequate to safely complete daily activities?: Yes Patient able to express need for assistance with ADLs?: Yes Independently performs ADLs?: Yes (appropriate for developmental age)  Prior Inpatient Therapy Prior  Inpatient Therapy: Yes Prior Therapy Dates:  (20 years ago went to [employer] [employer]) Reason for Treatment:  (substance abuse and HI)  Prior Outpatient Therapy Prior Outpatient Therapy: No  ADL Screening (condition at time of admission) Patient's cognitive ability adequate to safely complete daily activities?: Yes Patient able to express need for assistance with ADLs?: Yes Independently performs ADLs?: Yes (appropriate for developmental age)                  Additional Information 1:1 In Past 12 Months?: No CIRT Risk: No Elopement Risk: No Does patient have medical clearance?: No     Disposition:  Disposition Initial Assessment Completed for this Encounter: Yes Disposition of Patient: Outpatient treatment  On Site Evaluation by:   Reviewed with Physician:    Genia Del 12/13/2012 11:59 AM

## 2012-12-18 ENCOUNTER — Ambulatory Visit (HOSPITAL_COMMUNITY): Payer: Self-pay | Admitting: Psychology

## 2012-12-24 ENCOUNTER — Ambulatory Visit (HOSPITAL_COMMUNITY): Payer: Self-pay | Admitting: Psychiatry

## 2012-12-31 ENCOUNTER — Encounter (HOSPITAL_COMMUNITY): Payer: Self-pay

## 2012-12-31 ENCOUNTER — Other Ambulatory Visit (HOSPITAL_COMMUNITY): Payer: Medicare Other | Admitting: Psychiatry

## 2012-12-31 ENCOUNTER — Telehealth (HOSPITAL_COMMUNITY): Payer: Self-pay

## 2012-12-31 DIAGNOSIS — F3130 Bipolar disorder, current episode depressed, mild or moderate severity, unspecified: Secondary | ICD-10-CM | POA: Insufficient documentation

## 2012-12-31 DIAGNOSIS — F411 Generalized anxiety disorder: Secondary | ICD-10-CM

## 2012-12-31 DIAGNOSIS — F329 Major depressive disorder, single episode, unspecified: Secondary | ICD-10-CM

## 2012-12-31 NOTE — Progress Notes (Signed)
Patient ID: Craig [name], male   DOB: [birth year], 37 y.o.   MRN: [id number] D:  This is a 37 yr old, separated, caucasian, male, who was referred per Dr. Tenny Craw, treatment for mood swings, depressive and anxiety symptoms.  Denies SI/HI or A/V hallucinations.  C/O sadness, irritability, fluctuating appetite and sleep (4-8 hrs), decreased concentration, agitation, and anxiety.  Reports struggling with the above symptoms for years.  States he was diagnosed with Bipolar Disorder at age 89.  Been on disability since 2012 d/t Bipolar Disorder.  Two previous psychiatric hospitalizations when he was diagnosed.  States he was hospitalized due to drug/ETOH.  One previous suicidal gesture (cut wrists) years ago.  Family Hx:  Deceased Father struggled with alcoholism and deceased mother struggled with panic attacks.   Stressors:  1)  Chronic Pain:  Pt has a bulging disc in his back.  Also, c/o pain in left knee and right ankle.  2)  Separated from wife on 07-21-12, one day after their anniversary.  Couple had been married for fourteen years.  Pt states he left wife due to her previous affair, in which she had in 2010.  "I found out in 2012.  She continues to talk to him.  She said they are just friends, but I couldn't take it so I left."  Pt has moved into a trailer. Childhood:  Raised on a farm in [place], [place].  Father was an alcoholic.  Pt was dx with Bipolar at age 11.  Pt started having problems with drugs/ETOH at this age.  Reports that school was very difficult for him.  "I stayed in trouble and skipped school."  Dropped out in 11th grade.  Pt states he had a learning disability.  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 Siblings:  One older brother and one older sister. Kids:  Five kids (ages:  16, 25, 35, 38 and 71 mo old)  All girls. Drugs/ETOH:  Denies drugs/ETOH.  Hx of drug/ETOH dependence when he was age 82.  Denied DUI.  Denies any current drugs/ETOH.  Admits to craving  THC. Reports that his support system includes his estranged wife. Pt completed all forms.  Scored 27 on the burns.  Pt will attend [facility]-IOP for ten days.  A:  Oriented pt.  Provided pt with an orientation folder.  Inquired if pt had been to [place] or been around anyone who had within the past 21 days.  Informed Dr.Ross of admit.  Encouraged support groups.  Will refer pt to a therapist.  R:  Pt receptive.

## 2013-01-01 ENCOUNTER — Other Ambulatory Visit (HOSPITAL_COMMUNITY): Payer: Medicare Other

## 2013-01-01 NOTE — Progress Notes (Signed)
    Daily Group Progress Note  Program: IOP  Group Time: 9:00-10:30 am   Participation Level: None  Behavioral Response: none  Type of Therapy:  Initial assessment  Summary of Progress: Pt was completing the initial assessment and orientation by the case manager.      Group Time: 10:30 am - 12:00 pm   Participation Level:  None  Behavioral Response: none  Type of Therapy: Initial Medication Assessment  Summary of Progress: Pt was completing the initial medical assessment by the psychiatrist.  Carman Ching, LCSW

## 2013-01-02 ENCOUNTER — Other Ambulatory Visit (HOSPITAL_COMMUNITY): Payer: Medicare Other | Attending: Psychiatry

## 2013-01-02 NOTE — Progress Notes (Unsigned)
Psychiatric Assessment Adult  Patient Identification:  Craig [name] Date of Evaluation:  01/02/2013 Chief Complaint: Depression and anxiety History of Chief Complaint:   37 yr old, separated, caucasian, male, who was referred per Dr. Tenny Craw, treatment for mood swings, depressive and anxiety symptoms. Denies SI/HI or A/V hallucinations. C/O sadness, irritability, fluctuating appetite and sleep (4-8 hrs), decreased concentration, agitation, and anxiety. Reports struggling with the above symptoms for years. States he was diagnosed with Bipolar Disorder at age 76. Been on disability since 2012 d/t Bipolar Disorder. Two previous psychiatric hospitalizations when he was diagnosed. States he was hospitalized due to drug/ETOH. One previous suicidal gesture (cut wrists) years ago. Family Hx: Deceased Father struggled with alcoholism and deceased mother struggled with panic attacks.  Stressors: 1) Chronic Pain: Pt has a bulging disc in his back. Also, c/o pain in left knee and right ankle. 2) Separated from wife on 07-21-12, one day after their anniversary. Couple had been married for fourteen years. Pt states he left wife due to her previous affair, in which she had in 2010. "I found out in 2012. She continues to talk to him. She said they are just friends, but I couldn't take it so I left." Pt has moved into a trailer.  Childhood: Raised on a farm in [place], [place]. Father was an alcoholic. Pt was dx with Bipolar at age 66. Pt started having problems with drugs/ETOH at this age. Reports that school was very difficult for him. "I stayed in trouble and skipped school." Dropped out in 11th grade. Pt states he had a learning disability. 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  Siblings: One older brother and one older sister.  Kids: Five kids (ages: 59, 59, 43, 12 and 41 mo old) All girls.  Drugs/ETOH: Denies drugs/ETOH. Hx of drug/ETOH dependence when he was age 61. Denied DUI.  Denies any current drugs/ETOH. Admits to craving THC.  Reports that his support system includes his estranged wife.  HPI Review of Systems Physical Exam  Depressive Symptoms: depressed mood, anhedonia, insomnia, hypersomnia, psychomotor retardation, fatigue, feelings of worthlessness/guilt, difficulty concentrating, hopelessness, anxiety, loss of energy/fatigue, weight gain, increased appetite, decreased appetite,  (Hypo) Manic Symptoms:   Elevated Mood:  No Irritable Mood:  No Grandiosity:  No Distractibility:  Yes Labiality of Mood:  Yes Delusions:  No Hallucinations:  No Impulsivity:  Yes Sexually Inappropriate Behavior:  No Financial Extravagance:  No Flight of Ideas:  No  Anxiety Symptoms: Excessive Worry:  Yes Panic Symptoms:  No Agoraphobia:  No Obsessive Compulsive: No  Symptoms: None, Specific Phobias:  No Social Anxiety:  No  Psychotic Symptoms: None  PTSD Symptoms: None   Traumatic Brain Injury: No   Past Psychiatric History: Diagnosis:   Hospitalizations:   Outpatient Care:   Substance Abuse Care:   Self-Mutilation:   Suicidal Attempts:   Violent Behaviors:    Past Medical History:   Past Medical History  Diagnosis Date  . Bipolar 1 disorder   . Depression   . Schizophrenia   . Anxiety   . Chronic back pain   . Fracture, ankle    History of Loss of Consciousness:  {BHH YES OR NO:22294} Seizure History:  {BHH YES OR NO:22294} Cardiac History:  {BHH YES OR NO:22294} Allergies:   Allergies  Allergen Reactions  . Penicillins Swelling and Rash   Current Medications:  Current Outpatient Prescriptions  Medication Sig Dispense Refill  . albuterol (PROAIR HFA)  108 (90 BASE) MCG/ACT inhaler Inhale 2 puffs into the lungs 4 (four) times daily as needed for wheezing or shortness of breath.      . clonazePAM (KLONOPIN) 0.5 MG tablet Take 1 tablet (0.5 mg total) by mouth 3 (three) times daily as needed for anxiety.  90 tablet  1  .  dextroamphetamine (DEXTROSTAT) 5 MG tablet Take 5 mg by mouth 2 (two) times daily. Take 5mg  in a.m. And at noon      . HYDROcodone-acetaminophen (NORCO/VICODIN) 5-325 MG per tablet Take 2 tablets by mouth every 4 (four) hours as needed for pain.  10 tablet  0  . lithium carbonate (LITHOBID) 300 MG CR tablet Take 2 tablets (600 mg total) by mouth 2 (two) times daily.  120 tablet  3  . testosterone (ANDROGEL) 50 MG/5GM GEL Place 5 g onto the skin daily.       No current facility-administered medications for this visit.    Previous Psychotropic Medications:  Medication Dose   ***  ***                     Substance Abuse History in the last 12 months: Substance Age of 1st Use Last Use Amount Specific Type  Nicotine  ***  ***  ***  ***  Alcohol  ***  ***  ***  ***  Cannabis  ***  ***  ***  ***  Opiates  ***  ***  ***  ***  Cocaine  ***  ***  ***  ***  Methamphetamines  ***  ***  ***  ***  LSD  ***  ***  ***  ***  Ecstasy  ***   ***  ***  ***  Benzodiazepines  ***  ***  ***  ***  Caffeine  ***  ***  ***  ***  Inhalants  ***  ***  ***  ***  Others:                          Medical Consequences of Substance Abuse: ***  Legal Consequences of Substance Abuse: ***  Family Consequences of Substance Abuse: ***  Blackouts:  {BHH YES OR NO:22294} DT's:  {BHH YES OR NO:22294} Withdrawal Symptoms:  {BHH YES OR NO:22294} {Withdrawal Symptoms:22677}  Social History: Current Place of Residence: *** Place of Birth: *** Family Members: *** Marital Status:  {Marital Status:22678} Children: ***  Sons: ***  Daughters: *** Relationships: *** Education:  {Education:22679} Educational Problems/Performance: *** Religious Beliefs/Practices: *** History of Abuse: {Desc; abuse:16542} Occupational Experiences; Military History:  {Military History:22680} Legal History: *** Hobbies/Interests: ***  Family History:   Family History  Problem Relation Age of Onset  . Alcohol abuse  Father   . Anxiety disorder Mother     Mental Status Examination/Evaluation: Objective:  Appearance: {Appearance:22683}  Eye Contact::  {BHH EYE CONTACT:22684}  Speech:  {Speech:22685}  Volume:  {Volume (PAA):22686}  Mood:  ***  Affect:  {Affect (PAA):22687}  Thought Process:  {Thought Process (PAA):22688}  Orientation:  {BHH ORIENTATION (PAA):22689}  Thought Content:  {Thought Content:22690}  Suicidal Thoughts:  {ST/HT (PAA):22692}  Homicidal Thoughts:  {ST/HT (PAA):22692}  Judgement:  {Judgement (PAA):22694}  Insight:  {Insight (PAA):22695}  Psychomotor Activity:  {Psychomotor (PAA):22696}  Akathisia:  {BHH YES OR NO:22294}  Handed:  {Handed:22697}  AIMS (if indicated):  ***  Assets:  {Assets (PAA):22698}    Laboratory/X-Ray Psychological Evaluation(s)   ***  ***   Assessment:  {axis diagnosis:3049000}  AXIS I {  psych axis 1:31909}  AXIS II {psych axis 2:31910}  AXIS III Past Medical History  Diagnosis Date  . Bipolar 1 disorder   . Depression   . Schizophrenia   . Anxiety   . Chronic back pain   . Fracture, ankle      AXIS IV {psych axis iv:31915}  AXIS V {psych axis v score:31919}   Treatment Plan/Recommendations:  Plan of Care: ***  Laboratory:  {Laboratory:22682}  Psychotherapy: ***  Medications: ***  Routine PRN Medications:  {BHH YES OR NO:22294}  Consultations: ***  Safety Concerns:  ***  Other:      [facility] Psych 12/17/20143:32 PM

## 2013-01-03 ENCOUNTER — Other Ambulatory Visit (HOSPITAL_COMMUNITY): Payer: Medicare Other

## 2013-01-04 ENCOUNTER — Other Ambulatory Visit (HOSPITAL_COMMUNITY): Payer: Medicare Other

## 2013-01-04 NOTE — Progress Notes (Signed)
Psychiatric Assessment Adult  Patient Identification:  Craig [name] Date of Evaluation:  12/31/12 Chief Complaint: Depression anxiety History of Chief Complaint:  37 yr old, separated, caucasian, male, who was referred per Dr. Tenny Craw, treatment for mood swings, depressive and anxiety symptoms. Denies SI/HI or A/V hallucinations. C/O sadness, irritability, fluctuating appetite and sleep (4-8 hrs), decreased concentration, agitation, and anxiety. Reports struggling with the above symptoms for years. States he was diagnosed with Bipolar Disorder at age 68. Been on disability since 2012 d/t Bipolar Disorder. Two previous psychiatric hospitalizations when he was diagnosed. States he was hospitalized due to drug/ETOH. One previous suicidal gesture (cut wrists) years ago. Family Hx: Deceased Father struggled with alcoholism and deceased mother struggled with panic attacks.  Stressors: 1) Chronic Pain: Pt has a bulging disc in his back. Also, c/o pain in left knee and right ankle. 2) Separated from wife on 07-21-12, one day after their anniversary. Couple had been married for fourteen years. Pt states he left wife due to her previous affair, in which she had in 2010. "I found out in 2012. She continues to talk to him. She said they are just friends, but I couldn't take it so I left." Pt has moved into a trailer.  Childhood: Raised [place] a farm in [place], [place]. Father was an alcoholic. Pt was dx with Bipolar at age 30. Pt started having problems with drugs/ETOH at this age. Reports that school was very difficult for him. "I stayed in trouble and skipped school." Dropped out in 11th grade. Pt states he had a learning disability. 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  Siblings: One older brother and one older sister.  Kids: Five kids (ages: 45, 25, 62, 30 and 76 mo old) All girls.  Drugs/ETOH: Denies drugs/ETOH. Hx of drug/ETOH dependence when he was age 28. Denied DUI. Denies any  current drugs/ETOH. Admits to craving THC.  Reports that his support system includes his estranged wife.  HPI  Review of Systems  Physical Exam  Depressive Symptoms: depressed mood,  anhedonia,  insomnia,  hypersomnia,  psychomotor retardation,  fatigue,  feelings of worthlessness/guilt,  difficulty concentrating,  hopelessness,  anxiety,  loss of energy/fatigue,  weight gain,  increased appetite,  decreased appetite,  (Hypo) Manic Symptoms:  Elevated Mood: No  Irritable Mood: No  Grandiosity: No  Distractibility: Yes  Labiality of Mood: Yes  Delusions: No  Hallucinations: No  Impulsivity: Yes  Sexually Inappropriate Behavior: No  Financial Extravagance: No  Flight of Ideas: No  Anxiety Symptoms:  Excessive Worry: Yes  Panic Symptoms: No  Agoraphobia: No  Obsessive Compulsive: No  Symptoms: None,  Specific Phobias: No  Social Anxiety: No  Psychotic Symptoms: None  PTSD Symptoms: None  Traumatic Brain Injury: No    HPI Review of Systems Physical Exam   Past Psychiatric History: Diagnosis:   [facility] last hospitalization was in November 2 014   Outpatient Care: Sees Dr. Tenny Craw at [facility] in [place]. Bipolar disorder at age 27. [facility]   Substance Abuse Care: None but patient has been sober for 15 years   Self-Mutilation:   Suicidal Attempts:   Violent Behaviors:    Past Medical History:   Past Medical History  Diagnosis Date  . Bipolar 1 disorder   . Depression   . Schizophrenia   . Anxiety   . Chronic back pain   .  Fracture, ankle    History of Loss of Consciousness:  No Seizure History:  Yes as a child was treated for it to grow out affect his last seizure was 20 years ago Cardiac History:  No Allergies:   Allergies  Allergen Reactions  . Penicillins Swelling and Rash   Current Medications:  Current Outpatient Prescriptions  Medication Sig Dispense Refill  .  albuterol (PROAIR HFA) 108 (90 BASE) MCG/ACT inhaler Inhale 2 puffs into the lungs 4 (four) times daily as needed for wheezing or shortness of breath.      . clonazePAM (KLONOPIN) 0.5 MG tablet Take 1 tablet (0.5 mg total) by mouth 3 (three) times daily as needed for anxiety.  90 tablet  1  . dextroamphetamine (DEXTROSTAT) 5 MG tablet Take 5 mg by mouth 2 (two) times daily. Take 5mg  in a.m. And at noon      . HYDROcodone-acetaminophen (NORCO/VICODIN) 5-325 MG per tablet Take 2 tablets by mouth every 4 (four) hours as needed for pain.  10 tablet  0  . lithium carbonate (LITHOBID) 300 MG CR tablet Take 2 tablets (600 mg total) by mouth 2 (two) times daily.  120 tablet  3  . testosterone (ANDROGEL) 50 MG/5GM GEL Place 5 g onto the skin daily.       No current facility-administered medications for this visit.    Previous Psychotropic Medications:  Medication Dose   Tried on Paxil and BuSpar                        Substance Abuse History in the last 12 months: Substance Age of 1st Use Last Use Amount Specific Type  Nicotine  teenager   today   one pack per day    Alcohol  teenager    22 years   heavy drinker between the ages of [age]    Cannabis  teenager   heavy use until his mid 59s   joints    Opiates      Cocaine      Methamphetamines      LSD      Ecstasy      Benzodiazepines      Caffeine      Inhalants      Others:                          Medical Consequences of Substance Abuse:   Legal Consequences of Substance Abuse:   Family Consequences of Substance Abuse:   Blackouts:  No DT's:  No Withdrawal Symptoms:  No None  Social History: Current Place of Residence:  Place of Birth:  Family Members:  Marital Status:  Separated Children: 5  Sons:   Daughters:  Relationships:  Education:  GED Educational Problems/Performance: poor Religious Beliefs/Practices:  History of Abuse: none Teacher, music History:  None. Legal History:  None Hobbies/Interests: None  Family History:   Family History  Problem Relation Age of Onset  . Alcohol abuse Father   . Anxiety disorder Mother     Mental Status Examination/Evaluation: Objective:  Appearance: Disheveled   Eye Contact::  Poor  Speech:  Slow  Volume:  Decreased  Mood:  Anxious and dysphoric   Affect:  Constricted and Depressed  Thought Process:  Goal Directed and Linear  Orientation:  Full (Time, Place, and Person)  Thought Content:  Rumination  Suicidal Thoughts:  No  Homicidal Thoughts:  No  Judgement:  fair  Insight:  Shallow  Psychomotor Activity:  Normal and Restlessness  Akathisia:  No  Handed:  Right  AIMS (if indicated):  0  Assets:  Communication Skills Desire for Improvement Physical Health Resilience Social Support    Laboratory/X-Ray Psychological Evaluation(s)  And     Assessment:    AXIS I ADHD, combined type, Anxiety Disorder NOS and Bipolar, Depressed  AXIS II Deferred  AXIS III Past Medical History  Diagnosis Date  . Bipolar 1 disorder   . Depression   . Schizophrenia   . Anxiety   . Chronic back pain   . Fracture, ankle      AXIS IV economic problems, housing problems, occupational problems, other psychosocial or environmental problems, problems related to social environment and problems with primary support group  AXIS V 51-60 moderate symptoms   Treatment Plan/Recommendations:  Plan of Care: Start IOP   Laboratory:  None   Psychotherapy: Group and individual therapy   Medications: Discussed rationale risks benefits options off Dextrostat 5 mg a.m. and known and patient gave me his informed consent. Patient will continue Klonopin   Routine PRN Medications:  Yes  Consultations:   Safety Concerns:  None   Other:  Estimated length of stay 2 weeks     Margit Banda, MD 12/31/12 11 AM

## 2013-01-04 NOTE — Progress Notes (Signed)
Patient ID: Craig [name], male   DOB: 1975/08/14, 37 y.o.   MRN: [id number] Patient reviewed and interviewed today, showed up for his first day of group today. States he is tolerating the medications well and notices no effect although outwardly he appears calm. Patient has been busy with Christmas gifts and Christmas stuff with his children. Denies suicidal or homicidal ideation no hallucinations are diffusions. Is tolerating his medications well

## 2013-01-07 ENCOUNTER — Other Ambulatory Visit (HOSPITAL_COMMUNITY): Payer: Medicare Other

## 2013-01-08 ENCOUNTER — Other Ambulatory Visit (HOSPITAL_COMMUNITY): Payer: Medicare Other

## 2013-01-09 ENCOUNTER — Other Ambulatory Visit (HOSPITAL_COMMUNITY): Payer: Medicare Other

## 2013-01-11 ENCOUNTER — Other Ambulatory Visit (HOSPITAL_COMMUNITY): Payer: Medicare Other

## 2013-01-14 ENCOUNTER — Other Ambulatory Visit (HOSPITAL_COMMUNITY): Payer: Medicare Other

## 2013-01-15 ENCOUNTER — Other Ambulatory Visit (HOSPITAL_COMMUNITY): Payer: Medicare Other

## 2013-01-15 NOTE — Progress Notes (Signed)
Patient ID: Craig [name], male   DOB: 10/26/1975, 37 y.o.   MRN: [id number] D: This is a 37 yr old, separated, caucasian, male, who was referred per Dr. Tenny Craw, treatment for mood swings, depressive and anxiety symptoms. Denies SI/HI or A/V hallucinations. C/O sadness, irritability, fluctuating appetite and sleep (4-8 hrs), decreased concentration, agitation, and anxiety. Reports struggling with the above symptoms for years. States he was diagnosed with Bipolar Disorder at age 51. Been on disability since 2012 d/t Bipolar Disorder. Two previous psychiatric hospitalizations when he was diagnosed. States he was hospitalized due to drug/ETOH. One previous suicidal gesture (cut wrists) years ago. Family Hx: Deceased Father struggled with alcoholism and deceased mother struggled with panic attacks.  Stressors: 1) Chronic Pain: Pt has a bulging disc in his back. Also, c/o pain in left knee and right ankle. 2) Separated from wife on 07-21-12, one day after their anniversary. Couple had been married for fourteen years. Pt states he left wife due to her previous affair, in which she had in 2010. "I found out in 2012. She continues to talk to him. She said they are just friends, but I couldn't take it so I left." Pt has moved into a trailer. Pt only attended group one day.  States he will not be returning due to lack of gas money.  A:  D/C today.  F/U with Dr. Tenny Craw.  Encouraged support groups.  R:  Pt receptive.

## 2013-01-15 NOTE — Patient Instructions (Signed)
Patient will be discharged today (01-15-13).  Follow up with Dr. Tenny Craw.  Encouraged pt to call their office for an appointment.  Recommended support groups.

## 2013-01-15 NOTE — Progress Notes (Signed)
Discharge Note  Patient:  Craig [name] is an 37 y.o., male DOB:  [birth year]  Date of Admission:  12/31/12  Date of Discharge:  01/15/13  Reason for Admission: Depression and anxiety  Hospital Course: Patient started IOP and appeared to have significant difficulty with his concentration sleep and appetite and was irritable. He had been started on Klonopin 0.5 3 times a day, and lithium 600 twice a day and testosterone gel 50 mg. As he met the criteria are for ADHD combined type I started him on Dexedrine 5 mg a.m. and no on. After this patient attended IOP for one day and then did not return stating that he did not have gas money to come. Patient stated that he was tolerating his medications well and had no problems with that. Patient had separated from his wife but he missed his kids so he tried to spend more time with them.  Mental Status at Discharge: This was done on the phone patient was alert oriented x3, he denied suicidal or homicidal ideation and had no hallucinations or delusions. Memory appeared to be good, judgment and insight was good, concentration was fair recall was good.  Lab Results: No results found for this or any previous visit (from the past 48 hour(s)).  Current outpatient prescriptions:albuterol (PROAIR HFA) 108 (90 BASE) MCG/ACT inhaler, Inhale 2 puffs into the lungs 4 (four) times daily as needed for wheezing or shortness of breath., Disp: , Rfl: ;  clonazePAM (KLONOPIN) 0.5 MG tablet, Take 1 tablet (0.5 mg total) by mouth 3 (three) times daily as needed for anxiety., Disp: 90 tablet, Rfl: 1 dextroamphetamine (DEXTROSTAT) 5 MG tablet, Take 5 mg by mouth 2 (two) times daily. Take 5mg  in a.m. And at noon, Disp: , Rfl: ;  HYDROcodone-acetaminophen (NORCO/VICODIN) 5-325 MG per tablet, Take 2 tablets by mouth every 4 (four) hours as needed for pain., Disp: 10 tablet, Rfl: 0;  lithium carbonate (LITHOBID) 300 MG CR tablet, Take 2 tablets (600 mg total) by mouth 2 (two) times  daily., Disp: 120 tablet, Rfl: 3 testosterone (ANDROGEL) 50 MG/5GM GEL, Place 5 g onto the skin daily., Disp: , Rfl:   Axis Diagnosis:   Axis I: ADHD, combined type, Anxiety Disorder NOS and Bipolar, Depressed Axis II: Deferred Axis III:  Past Medical History  Diagnosis Date  . Bipolar 1 disorder   . Depression   . Schizophrenia   . Anxiety   . Chronic back pain   . Fracture, ankle    Axis IV: economic problems, housing problems, other psychosocial or environmental problems, problems related to social environment and problems with primary support group Axis V: 61-70 mild symptoms   Level of Care:  OP  Discharge destination:  Home  Is patient on multiple antipsychotic therapies at discharge:  No    Has Patient had three or more failed trials of antipsychotic monotherapy by history:  No  Patient phone:  [phone] (home)  Patient address:   [address] [place] [address],   Follow-up recommendations:  Activity:  As tolerated Diet:  Regular Other:  Followup for medications with Dr. Tenny Craw and therapy at this [facility] i  Comments:  None    Margit Banda 01/15/2013, 12:45 PM

## 2013-01-16 ENCOUNTER — Other Ambulatory Visit (HOSPITAL_COMMUNITY): Payer: Medicare Other

## 2013-01-18 ENCOUNTER — Other Ambulatory Visit (HOSPITAL_COMMUNITY): Payer: Medicare Other

## 2013-01-21 ENCOUNTER — Other Ambulatory Visit (HOSPITAL_COMMUNITY): Payer: Medicare Other

## 2013-01-22 ENCOUNTER — Other Ambulatory Visit (HOSPITAL_COMMUNITY): Payer: Medicare Other

## 2013-01-23 ENCOUNTER — Other Ambulatory Visit (HOSPITAL_COMMUNITY): Payer: Medicare Other

## 2013-01-24 ENCOUNTER — Other Ambulatory Visit (HOSPITAL_COMMUNITY): Payer: Medicare Other

## 2013-01-25 ENCOUNTER — Other Ambulatory Visit (HOSPITAL_COMMUNITY): Payer: Medicare Other

## 2013-01-28 ENCOUNTER — Other Ambulatory Visit (HOSPITAL_COMMUNITY): Payer: Medicare Other

## 2013-01-29 ENCOUNTER — Other Ambulatory Visit (HOSPITAL_COMMUNITY): Payer: Medicare Other

## 2013-01-30 ENCOUNTER — Other Ambulatory Visit (HOSPITAL_COMMUNITY): Payer: Medicare Other

## 2013-01-31 ENCOUNTER — Other Ambulatory Visit (HOSPITAL_COMMUNITY): Payer: Medicare Other

## 2013-02-01 ENCOUNTER — Other Ambulatory Visit (HOSPITAL_COMMUNITY): Payer: Medicare Other

## 2013-02-01 ENCOUNTER — Ambulatory Visit (HOSPITAL_COMMUNITY): Payer: Self-pay | Admitting: Psychiatry

## 2013-02-04 ENCOUNTER — Other Ambulatory Visit (HOSPITAL_COMMUNITY): Payer: Medicare Other

## 2013-02-05 ENCOUNTER — Other Ambulatory Visit (HOSPITAL_COMMUNITY): Payer: Medicare Other

## 2013-02-06 ENCOUNTER — Other Ambulatory Visit (HOSPITAL_COMMUNITY): Payer: Medicare Other

## 2013-02-07 ENCOUNTER — Other Ambulatory Visit (HOSPITAL_COMMUNITY): Payer: Medicare Other

## 2013-02-08 ENCOUNTER — Other Ambulatory Visit (HOSPITAL_COMMUNITY): Payer: Medicare Other

## 2013-02-13 ENCOUNTER — Ambulatory Visit (HOSPITAL_COMMUNITY): Payer: Self-pay | Admitting: Psychiatry

## 2013-02-13 ENCOUNTER — Ambulatory Visit (INDEPENDENT_AMBULATORY_CARE_PROVIDER_SITE_OTHER): Payer: Medicare Other | Admitting: Psychiatry

## 2013-02-13 ENCOUNTER — Encounter (HOSPITAL_COMMUNITY): Payer: Self-pay | Admitting: Psychiatry

## 2013-02-13 VITALS — BP 118/72 | Ht 65.0 in | Wt 187.0 lb

## 2013-02-13 DIAGNOSIS — F3130 Bipolar disorder, current episode depressed, mild or moderate severity, unspecified: Secondary | ICD-10-CM

## 2013-02-13 MED ORDER — ALPRAZOLAM 0.5 MG PO TABS: 0.5000 mg | ORAL_TABLET | Freq: Three times a day (TID) | ORAL | Status: DC | PRN

## 2013-02-13 MED ORDER — LITHIUM CARBONATE ER 300 MG PO TBCR: 600.0000 mg | EXTENDED_RELEASE_TABLET | Freq: Two times a day (BID) | ORAL | Status: DC

## 2013-02-13 MED ORDER — ESCITALOPRAM OXALATE 20 MG PO TABS: 20.0000 mg | ORAL_TABLET | Freq: Every day | ORAL | Status: DC
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
## 2013-02-20 ENCOUNTER — Telehealth (HOSPITAL_COMMUNITY): Payer: Self-pay | Admitting: *Deleted

## 2013-02-20 LAB — LITHIUM LEVEL

## 2013-02-20 NOTE — Telephone Encounter (Signed)
Called back about amitrip-left message, ok to take at qhs. Lithium level is low, asked to call back

## 2013-02-21 NOTE — Telephone Encounter (Signed)
Spoke to pt in office, he had run our of lithium before checking the level

## 2013-02-26 ENCOUNTER — Ambulatory Visit (HOSPITAL_COMMUNITY): Payer: Self-pay | Admitting: Psychology

## 2013-02-27 ENCOUNTER — Ambulatory Visit (INDEPENDENT_AMBULATORY_CARE_PROVIDER_SITE_OTHER): Payer: Medicare Other | Admitting: Psychology

## 2013-02-27 DIAGNOSIS — F3130 Bipolar disorder, current episode depressed, mild or moderate severity, unspecified: Secondary | ICD-10-CM

## 2013-02-27 DIAGNOSIS — F0781 Postconcussional syndrome: Secondary | ICD-10-CM

## 2013-03-11 ENCOUNTER — Ambulatory Visit (HOSPITAL_COMMUNITY): Payer: Self-pay | Admitting: Psychiatry

## 2013-03-14 ENCOUNTER — Ambulatory Visit (HOSPITAL_COMMUNITY): Payer: Self-pay | Admitting: Psychiatry

## 2013-03-19 ENCOUNTER — Ambulatory Visit (HOSPITAL_COMMUNITY): Payer: Self-pay | Admitting: Psychiatry

## 2013-03-28 ENCOUNTER — Ambulatory Visit (INDEPENDENT_AMBULATORY_CARE_PROVIDER_SITE_OTHER): Payer: Medicare Other | Admitting: Psychology

## 2013-03-28 DIAGNOSIS — S069X9A Unspecified intracranial injury with loss of consciousness of unspecified duration, initial encounter: Secondary | ICD-10-CM

## 2013-03-28 DIAGNOSIS — X58XXXA Exposure to other specified factors, initial encounter: Secondary | ICD-10-CM

## 2013-03-28 DIAGNOSIS — F3289 Other specified depressive episodes: Secondary | ICD-10-CM | POA: Diagnosis not present

## 2013-03-28 DIAGNOSIS — F32A Depression, unspecified: Secondary | ICD-10-CM

## 2013-03-28 DIAGNOSIS — F329 Major depressive disorder, single episode, unspecified: Secondary | ICD-10-CM

## 2013-03-28 DIAGNOSIS — S069X4A Unspecified intracranial injury with loss of consciousness of 6 hours to 24 hours, initial encounter: Secondary | ICD-10-CM

## 2013-03-28 DIAGNOSIS — F810 Specific reading disorder: Secondary | ICD-10-CM

## 2013-03-28 NOTE — Progress Notes (Deleted)
Patient:  Craig CasterRobert J [name]   DOB: [birth year]  MR Number: [id number]  [facility]: [facility] [address] [place] [address] Dept: [phone]  Start: *** End: ***  Provider/Observer:     Hershal CoriaJohn R Rodenbough PSYD  Chief Complaint:     No chief complaint on file.   Reason For Service:     ***  Interventions Strategy:  ***  Participation Level:   {BHH PARTICIPATION LEVEL:22264}  Participation Quality:  {BHH PARTICIPATION QUALITY:22265}      Behavioral Observation:  {Appearance:22683}, {BHH LEVEL OF CONSCIOUSNESS:22305}, and {BHH AFFECT:22307}.   Current Psychosocial Factors: ***  Content of Session:   ***  Current Status:   ***  Patient Progress:   ***  Target Goals:   ***  Last Reviewed:   ***  Goals Addressed Today:    ***  Impression/Diagnosis:   ***  Diagnosis:    Axis I: No diagnosis found.      Axis II: {psych axis 2:31910}

## 2013-04-05 ENCOUNTER — Emergency Department (HOSPITAL_COMMUNITY)
Admission: EM | Admit: 2013-04-05 | Discharge: 2013-04-05 | Disposition: A | Discharge status: Home / Self Care | Payer: Medicare Other | Attending: Emergency Medicine | Admitting: Emergency Medicine

## 2013-04-05 ENCOUNTER — Encounter (HOSPITAL_COMMUNITY): Payer: Self-pay | Admitting: Emergency Medicine

## 2013-04-05 DIAGNOSIS — F3130 Bipolar disorder, current episode depressed, mild or moderate severity, unspecified: Secondary | ICD-10-CM | POA: Insufficient documentation

## 2013-04-05 DIAGNOSIS — H6690 Otitis media, unspecified, unspecified ear: Secondary | ICD-10-CM | POA: Insufficient documentation

## 2013-04-05 DIAGNOSIS — Z8781 Personal history of (healed) traumatic fracture: Secondary | ICD-10-CM | POA: Insufficient documentation

## 2013-04-05 DIAGNOSIS — F411 Generalized anxiety disorder: Secondary | ICD-10-CM | POA: Insufficient documentation

## 2013-04-05 DIAGNOSIS — F17200 Nicotine dependence, unspecified, uncomplicated: Secondary | ICD-10-CM | POA: Insufficient documentation

## 2013-04-05 DIAGNOSIS — F209 Schizophrenia, unspecified: Secondary | ICD-10-CM | POA: Insufficient documentation

## 2013-04-05 DIAGNOSIS — G8929 Other chronic pain: Secondary | ICD-10-CM | POA: Insufficient documentation

## 2013-04-05 DIAGNOSIS — Z880 Allergy status to penicillin: Secondary | ICD-10-CM | POA: Insufficient documentation

## 2013-04-05 DIAGNOSIS — Z79899 Other long term (current) drug therapy: Secondary | ICD-10-CM | POA: Insufficient documentation

## 2013-04-05 DIAGNOSIS — H6692 Otitis media, unspecified, left ear: Secondary | ICD-10-CM

## 2013-04-05 MED ORDER — SULFAMETHOXAZOLE-TMP DS 800-160 MG PO TABS
1.0000 | ORAL_TABLET | Freq: Once | ORAL | Status: AC
  Administered 2013-04-05: 1 via ORAL
  Filled 2013-04-05: qty 1

## 2013-04-05 MED ORDER — OXYCODONE-ACETAMINOPHEN 5-325 MG PO TABS: 1.0000 | ORAL_TABLET | ORAL | Status: DC | PRN

## 2013-04-05 MED ORDER — SULFAMETHOXAZOLE-TRIMETHOPRIM 800-160 MG PO TABS: 1.0000 | ORAL_TABLET | Freq: Two times a day (BID) | ORAL | Status: AC

## 2013-04-05 NOTE — Discharge Instructions (Signed)
Stop smoking!  Take Ibuprofen or acetaminophen as needed for less severe pain.  Otitis Media, Adult Otitis media is redness, soreness, and swelling (inflammation) of the middle ear. Otitis media may be caused by allergies or, most commonly, by infection. Often it occurs as a complication of the common cold. SIGNS AND SYMPTOMS Symptoms of otitis media may include:  Earache.  Fever.  Ringing in your ear.  Headache.  Leakage of fluid from the ear. DIAGNOSIS To diagnose otitis media, your health care provider will examine your ear with an otoscope. This is an instrument that allows your health care provider to see into your ear in order to examine your eardrum. Your health care provider also will ask you questions about your symptoms. TREATMENT  Typically, otitis media resolves on its own within 3 5 days. Your health care provider may prescribe medicine to ease your symptoms of pain. If otitis media does not resolve within 5 days or is recurrent, your health care provider may prescribe antibiotic medicines if he or she suspects that a bacterial infection is the cause. HOME CARE INSTRUCTIONS   Take your medicine as directed until it is gone, even if you feel better after the first few days.  Only take over-the-counter or prescription medicines for pain, discomfort, or fever as directed by your health care provider.  Follow up with your health care provider as directed. SEEK MEDICAL CARE IF:  You have otitis media only in one ear or bleeding from your nose or both.  You notice a lump on your neck.  You are not getting better in 3 5 days.  You feel worse instead of better. SEEK IMMEDIATE MEDICAL CARE IF:   You have pain that is not controlled with medicine.  You have swelling, redness, or pain around your ear or stiffness in your neck.  You notice that part of your face is paralyzed.  You notice that the bone behind your ear (mastoid) is tender when you touch it. MAKE SURE  YOU:   Understand these instructions.  Will watch your condition.  Will get help right away if you are not doing well or get worse. Document Released: 10/09/2003 Document Revised: 10/24/2012 Document Reviewed: 07/31/2012 [employer] Patient Information 2014 [place], [place].  Sulfamethoxazole; Trimethoprim, SMX-TMP tablets What is this medicine? SULFAMETHOXAZOLE; TRIMETHOPRIM or SMX-TMP (suhl fuh meth OK suh zohl; trye METH oh prim) is a combination of a sulfonamide antibiotic and a second antibiotic, trimethoprim. It is used to treat or prevent certain kinds of bacterial infections. It will not work for colds, flu, or other viral infections. This medicine may be used for other purposes; ask your health care provider or pharmacist if you have questions. COMMON BRAND NAME(S): Bacter-Aid DS , Bactrim DS, Bactrim, Septra DS, Septra What should I tell my health care provider before I take this medicine? They need to know if you have any of these conditions: -anemia -asthma -being treated with anticonvulsants -if you frequently drink alcohol containing drinks -kidney disease -liver disease -low level of folic acid or [id number] dehydrogenase -poor nutrition or malabsorption -porphyria -severe allergies -thyroid disorder -an unusual or allergic reaction to sulfamethoxazole, trimethoprim, sulfa drugs, other medicines, foods, dyes, or preservatives -pregnant or trying to get pregnant -breast-feeding How should I use this medicine? Take this medicine by mouth with a full glass of water. Follow the directions on the prescription label. Take your medicine at regular intervals. Do not take it more often than directed. Do not skip doses or stop your medicine  early. Talk to your pediatrician regarding the use of this medicine in children. Special care may be needed. This medicine has been used in children as young as 21 months of age. Overdosage: If you think you have taken too much of this  medicine contact a poison control center or emergency room at once. NOTE: This medicine is only for you. Do not share this medicine with others. What if I miss a dose? If you miss a dose, take it as soon as you can. If it is almost time for your next dose, take only that dose. Do not take double or extra doses. What may interact with this medicine? Do not take this medicine with any of the following medications: -aminobenzoate potassium -dofetilide -metronidazole This medicine may also interact with the following medications: -ACE inhibitors like benazepril, enalapril, lisinopril, and ramipril -cyclosporine -digoxin -diuretics -indomethacin -medicines for diabetes -methenamine -methotrexate -phenytoin -potassium supplements -pyrimethamine -sulfinpyrazone -tricyclic antidepressants -warfarin This list may not describe all possible interactions. Give your health care provider a list of all the medicines, herbs, non-prescription drugs, or dietary supplements you use. Also tell them if you smoke, drink alcohol, or use illegal drugs. Some items may interact with your medicine. What should I watch for while using this medicine? Tell your doctor or health care professional if your symptoms do not improve. Drink several glasses of water a day to reduce the risk of kidney problems. Do not treat diarrhea with over the counter products. Contact your doctor if you have diarrhea that lasts more than 2 days or if it is severe and watery. This medicine can make you more sensitive to the sun. Keep out of the sun. If you cannot avoid being in the sun, wear protective clothing and use a sunscreen. Do not use sun lamps or tanning beds/booths. What side effects may I notice from receiving this medicine? Side effects that you should report to your doctor or health care professional as soon as possible: -allergic reactions like skin rash or hives, swelling of the face, lips, or tongue -breathing  problems -fever or chills, sore throat -irregular heartbeat, chest pain -joint or muscle pain -pain or difficulty passing urine -red pinpoint spots on skin -redness, blistering, peeling or loosening of the skin, including inside the mouth -unusual bleeding or bruising -unusually weak or tired -yellowing of the eyes or skin Side effects that usually do not require medical attention (report to your doctor or health care professional if they continue or are bothersome): -diarrhea -dizziness -headache -loss of appetite -nausea, vomiting -nervousness This list may not describe all possible side effects. Call your doctor for medical advice about side effects. You may report side effects to FDA at 1-800-FDA-1088. Where should I keep my medicine? Keep out of the reach of children. Store at room temperature between 20 to 25 degrees C (68 to 77 degrees F). Protect from light. Throw away any unused medicine after the expiration date. NOTE: This sheet is a summary. It may not cover all possible information. If you have questions about this medicine, talk to your doctor, pharmacist, or health care provider.  2014, [employer] Standard. (2007-09-05 11:32:51)  Acetaminophen; Oxycodone tablets What is this medicine? ACETAMINOPHEN; OXYCODONE (a set a MEE noe fen; ox i KOE done) is a pain reliever. It is used to treat mild to moderate pain. This medicine may be used for other purposes; ask your health care provider or pharmacist if you have questions. COMMON BRAND NAME(S): Endocet, Magnacet, Narvox, Percocet, Perloxx, Primalev, Primlev,  Roxicet, Xolox What should I tell my health care provider before I take this medicine? They need to know if you have any of these conditions: -brain tumor -Crohn's disease, inflammatory bowel disease, or ulcerative colitis -drug abuse or addiction -head injury -heart or circulation problems -if you often drink alcohol -kidney disease or problems going to the  bathroom -liver disease -lung disease, asthma, or breathing problems -an unusual or allergic reaction to acetaminophen, oxycodone, other opioid analgesics, other medicines, foods, dyes, or preservatives -pregnant or trying to get pregnant -breast-feeding How should I use this medicine? Take this medicine by mouth with a full glass of water. Follow the directions on the prescription label. Take your medicine at regular intervals. Do not take your medicine more often than directed. Talk to your pediatrician regarding the use of this medicine in children. Special care may be needed. Patients over 10540 years old may have a stronger reaction and need a smaller dose. Overdosage: If you think you have taken too much of this medicine contact a poison control center or emergency room at once. NOTE: This medicine is only for you. Do not share this medicine with others. What if I miss a dose? If you miss a dose, take it as soon as you can. If it is almost time for your next dose, take only that dose. Do not take double or extra doses. What may interact with this medicine? -alcohol -antihistamines -barbiturates like amobarbital, butalbital, butabarbital, methohexital, pentobarbital, phenobarbital, thiopental, and secobarbital -benztropine -drugs for bladder problems like solifenacin, trospium, oxybutynin, tolterodine, hyoscyamine, and methscopolamine -drugs for breathing problems like ipratropium and tiotropium -drugs for certain stomach or intestine problems like propantheline, homatropine methylbromide, glycopyrrolate, atropine, belladonna, and dicyclomine -general anesthetics like etomidate, ketamine, nitrous oxide, propofol, desflurane, enflurane, halothane, isoflurane, and sevoflurane -medicines for depression, anxiety, or psychotic disturbances -medicines for sleep -muscle relaxants -naltrexone -narcotic medicines (opiates) for pain -phenothiazines like perphenazine, thioridazine, chlorpromazine,  mesoridazine, fluphenazine, prochlorperazine, promazine, and trifluoperazine -scopolamine -tramadol -trihexyphenidyl This list may not describe all possible interactions. Give your health care provider a list of all the medicines, herbs, non-prescription drugs, or dietary supplements you use. Also tell them if you smoke, drink alcohol, or use illegal drugs. Some items may interact with your medicine. What should I watch for while using this medicine? Tell your doctor or health care professional if your pain does not go away, if it gets worse, or if you have new or a different type of pain. You may develop tolerance to the medicine. Tolerance means that you will need a higher dose of the medication for pain relief. Tolerance is normal and is expected if you take this medicine for a long time. Do not suddenly stop taking your medicine because you may develop a severe reaction. Your body becomes used to the medicine. This does NOT mean you are addicted. Addiction is a behavior related to getting and using a drug for a non-medical reason. If you have pain, you have a medical reason to take pain medicine. Your doctor will tell you how much medicine to take. If your doctor wants you to stop the medicine, the dose will be slowly lowered over time to avoid any side effects. You may get drowsy or dizzy. Do not drive, use machinery, or do anything that needs mental alertness until you know how this medicine affects you. Do not stand or sit up quickly, especially if you are an older patient. This reduces the risk of dizzy or fainting spells. Alcohol may  interfere with the effect of this medicine. Avoid alcoholic drinks. There are different types of narcotic medicines (opiates) for pain. If you take more than one type at the same time, you may have more side effects. Give your health care provider a list of all medicines you use. Your doctor will tell you how much medicine to take. Do not take more medicine than  directed. Call emergency for help if you have problems breathing. The medicine will cause constipation. Try to have a bowel movement at least every 2 to 3 days. If you do not have a bowel movement for 3 days, call your doctor or health care professional. Do not take Tylenol (acetaminophen) or medicines that have acetaminophen with this medicine. Too much acetaminophen can be very dangerous. Many nonprescription medicines contain acetaminophen. Always read the labels carefully to avoid taking more acetaminophen. What side effects may I notice from receiving this medicine? Side effects that you should report to your doctor or health care professional as soon as possible: -allergic reactions like skin rash, itching or hives, swelling of the face, lips, or tongue -breathing difficulties, wheezing -confusion -light headedness or fainting spells -severe stomach pain -unusually weak or tired -yellowing of the skin or the whites of the eyes  Side effects that usually do not require medical attention (report to your doctor or health care professional if they continue or are bothersome): -dizziness -drowsiness -nausea -vomiting This list may not describe all possible side effects. Call your doctor for medical advice about side effects. You may report side effects to FDA at 1-800-FDA-1088. Where should I keep my medicine? Keep out of the reach of children. This medicine can be abused. Keep your medicine in a safe place to protect it from theft. Do not share this medicine with anyone. Selling or giving away this medicine is dangerous and against the law. Store at room temperature between 20 and 25 degrees C (68 and 77 degrees F). Keep container tightly closed. Protect from light. This medicine may cause accidental overdose and death if it is taken by other adults, children, or pets. Flush any unused medicine down the toilet to reduce the chance of harm. Do not use the medicine after the expiration  date. NOTE: This sheet is a summary. It may not cover all possible information. If you have questions about this medicine, talk to your doctor, pharmacist, or health care provider.  2014, [employer] Standard. (2012-08-27 13:17:35)

## 2013-04-05 NOTE — ED Notes (Signed)
Patient c/o left ear pain that is causing pain to go through his head.

## 2013-04-05 NOTE — ED Provider Notes (Signed)
CSN: [id number]     Arrival date & time 04/05/13  0405 History   None    Chief Complaint  Patient presents with  . Otalgia     (Consider location/radiation/quality/duration/timing/severity/associated sxs/prior Treatment) Patient is a 38 y.o. male presenting with ear pain. The history is provided by the patient.  Otalgia He complains of left ear pain for the last week. Pain is moderately severe and he rates it at 8/10. It is worse when he lies on his left side. He has not noticed any hearing loss. There's been no fever or chills. There's been no sore throat her cough or vomiting or diarrhea. He has not done anything to try and help his pain.  Past Medical History  Diagnosis Date  . Bipolar 1 disorder   . Depression   . Schizophrenia   . Anxiety   . Chronic back pain   . Fracture, ankle    Past Surgical History  Procedure Laterality Date  . Vasectomy     Family History  Problem Relation Age of Onset  . Alcohol abuse Father   . Anxiety disorder Mother    History  Substance Use Topics  . Smoking status: Current Every Day Smoker -- 1.00 packs/day    Types: Cigarettes  . Smokeless tobacco: Not on file  . Alcohol Use: No    Review of Systems  HENT: Positive for ear pain.   All other systems reviewed and are negative.      Allergies  Penicillins  Home Medications   Current Outpatient Rx  Name  Route  Sig  Dispense  Refill  . albuterol (PROAIR HFA) 108 (90 BASE) MCG/ACT inhaler   Inhalation   Inhale 2 puffs into the lungs 4 (four) times daily as needed for wheezing or shortness of breath.         . ALPRAZolam (XANAX) 0.5 MG tablet   Oral   Take 1 tablet (0.5 mg total) by mouth 3 (three) times daily as needed for sleep or anxiety.   90 tablet   1   . clonazePAM (KLONOPIN) 0.5 MG tablet   Oral   Take 1 tablet (0.5 mg total) by mouth 3 (three) times daily as needed for anxiety.   90 tablet   1   . escitalopram (LEXAPRO) 20 MG tablet   Oral   Take 1  tablet (20 mg total) by mouth daily.   30 tablet   1   . lithium carbonate (LITHOBID) 300 MG CR tablet   Oral   Take 2 tablets (600 mg total) by mouth 2 (two) times daily.   120 tablet   3   . tadalafil (CIALIS) 20 MG tablet   Oral   Take 20 mg by mouth daily as needed for erectile dysfunction.         [name] dextroamphetamine (DEXTROSTAT) 5 MG tablet   Oral   Take 5 mg by mouth 2 (two) times daily. Take 5mg  in a.m. And at noon         . HYDROcodone-acetaminophen (NORCO/VICODIN) 5-325 MG per tablet   Oral   Take 2 tablets by mouth every 4 (four) hours as needed for pain.   10 tablet   0   . testosterone (ANDROGEL) 50 MG/5GM GEL   Transdermal   Place 5 g onto the skin daily.          BP 134/77  Pulse 60  Temp(Src) 98.2 F (36.8 C) (Oral)  Resp 18  Ht 5\' 5"  (1.651  m)  Wt 183 lb (83.008 kg)  BMI 30.45 kg/m2  SpO2 97% Physical Exam  Nursing note and vitals reviewed.  38 year old male, resting comfortably and in no acute distress. Vital signs are normal. Oxygen saturation is 97%, which is normal. Head is normocephalic and atraumatic. PERRLA, EOMI. Oropharynx is clear. Left tympanic membrane is moderately erythematous. There is moderate amount of cerumen present without a cerumen impaction. Neck is nontender and supple without adenopathy or JVD. Back is nontender and there is no CVA tenderness. Lungs are clear without rales, wheezes, or rhonchi. Chest is nontender. Heart has regular rate and rhythm without murmur. Abdomen is soft, flat, nontender without masses or hepatosplenomegaly and peristalsis is normoactive. Extremities have no cyanosis or edema, full range of motion is present. Skin is warm and dry without rash. Neurologic: Mental status is normal, cranial nerves are intact, there are no motor or sensory deficits.  ED Course  Procedures (including critical care time)  MDM   Final diagnoses:  Left otitis media    Left otitis media. He is allergic to  penicillin, so he is given prescription for trimethoprim-sulfamethoxazole. He is also given prescription for oxycodone-acetaminophen for pain and is referred back to his PCP.    Dione Boozeavid Willem Klingensmith, MD 04/05/13 [phone]

## 2013-04-11 MED FILL — Oxycodone w/ Acetaminophen Tab 5-325 MG: ORAL | Qty: 6 | Status: AC

## 2013-04-12 ENCOUNTER — Encounter (HOSPITAL_COMMUNITY): Payer: Self-pay | Admitting: Psychology
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
## 2013-04-17 ENCOUNTER — Other Ambulatory Visit (HOSPITAL_COMMUNITY): Payer: Self-pay | Admitting: Psychiatry

## 2013-04-18 ENCOUNTER — Encounter (HOSPITAL_COMMUNITY): Payer: Self-pay | Admitting: Psychiatry

## 2013-04-18 ENCOUNTER — Telehealth (HOSPITAL_COMMUNITY): Payer: Self-pay | Admitting: *Deleted

## 2013-04-18 ENCOUNTER — Ambulatory Visit (INDEPENDENT_AMBULATORY_CARE_PROVIDER_SITE_OTHER): Payer: Medicare Other | Admitting: Psychiatry

## 2013-04-18 VITALS — BP 138/70 | Ht 65.0 in | Wt 189.0 lb

## 2013-04-18 DIAGNOSIS — F329 Major depressive disorder, single episode, unspecified: Secondary | ICD-10-CM

## 2013-04-18 DIAGNOSIS — F988 Other specified behavioral and emotional disorders with onset usually occurring in childhood and adolescence: Secondary | ICD-10-CM

## 2013-04-18 DIAGNOSIS — F32A Depression, unspecified: Secondary | ICD-10-CM

## 2013-04-18 DIAGNOSIS — F39 Unspecified mood [affective] disorder: Secondary | ICD-10-CM

## 2013-04-18 MED ORDER — ESCITALOPRAM OXALATE 20 MG PO TABS: 20.0000 mg | ORAL_TABLET | Freq: Every day | ORAL | Status: DC

## 2013-04-18 MED ORDER — ALPRAZOLAM 0.5 MG PO TABS: 0.5000 mg | ORAL_TABLET | Freq: Three times a day (TID) | ORAL | Status: DC | PRN

## 2013-04-18 MED ORDER — LITHIUM CARBONATE ER 300 MG PO TBCR: 600.0000 mg | EXTENDED_RELEASE_TABLET | Freq: Two times a day (BID) | ORAL | Status: DC
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
## 2013-04-18 NOTE — Telephone Encounter (Signed)
He came in today.

## 2013-04-20 LAB — LITHIUM LEVEL: LITHIUM LVL: 0.2 meq/L — AB (ref 0.80–1.40)

## 2013-05-14 ENCOUNTER — Ambulatory Visit (INDEPENDENT_AMBULATORY_CARE_PROVIDER_SITE_OTHER): Payer: Medicare Other | Admitting: Psychology

## 2013-05-14 ENCOUNTER — Encounter (HOSPITAL_COMMUNITY): Payer: Self-pay | Admitting: Psychology

## 2013-05-14 DIAGNOSIS — F3289 Other specified depressive episodes: Secondary | ICD-10-CM

## 2013-05-14 DIAGNOSIS — F329 Major depressive disorder, single episode, unspecified: Secondary | ICD-10-CM

## 2013-05-14 DIAGNOSIS — X58XXXA Exposure to other specified factors, initial encounter: Secondary | ICD-10-CM

## 2013-05-14 DIAGNOSIS — S069X9A Unspecified intracranial injury with loss of consciousness of unspecified duration, initial encounter: Secondary | ICD-10-CM

## 2013-05-14 DIAGNOSIS — S069X4A Unspecified intracranial injury with loss of consciousness of 6 hours to 24 hours, initial encounter: Secondary | ICD-10-CM

## 2013-05-14 DIAGNOSIS — F32A Depression, unspecified: Secondary | ICD-10-CM

## 2013-05-14 DIAGNOSIS — F411 Generalized anxiety disorder: Secondary | ICD-10-CM
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
## 2013-05-16 ENCOUNTER — Other Ambulatory Visit (HOSPITAL_COMMUNITY): Payer: Self-pay | Admitting: Psychiatry

## 2013-05-23 ENCOUNTER — Encounter (HOSPITAL_COMMUNITY): Payer: Self-pay | Admitting: Emergency Medicine

## 2013-05-23 ENCOUNTER — Emergency Department (HOSPITAL_COMMUNITY)
Admission: EM | Admit: 2013-05-23 | Discharge: 2013-05-23 | Disposition: A | Discharge status: Home / Self Care | Payer: Medicare Other | Attending: Emergency Medicine | Admitting: Emergency Medicine

## 2013-05-23 ENCOUNTER — Emergency Department (HOSPITAL_COMMUNITY): Payer: Medicare Other

## 2013-05-23 DIAGNOSIS — G40909 Epilepsy, unspecified, not intractable, without status epilepticus: Secondary | ICD-10-CM | POA: Insufficient documentation

## 2013-05-23 DIAGNOSIS — M25569 Pain in unspecified knee: Secondary | ICD-10-CM | POA: Insufficient documentation

## 2013-05-23 DIAGNOSIS — F411 Generalized anxiety disorder: Secondary | ICD-10-CM | POA: Insufficient documentation

## 2013-05-23 DIAGNOSIS — Z79899 Other long term (current) drug therapy: Secondary | ICD-10-CM | POA: Insufficient documentation

## 2013-05-23 DIAGNOSIS — Z880 Allergy status to penicillin: Secondary | ICD-10-CM | POA: Insufficient documentation

## 2013-05-23 DIAGNOSIS — M549 Dorsalgia, unspecified: Secondary | ICD-10-CM | POA: Insufficient documentation

## 2013-05-23 DIAGNOSIS — Z8781 Personal history of (healed) traumatic fracture: Secondary | ICD-10-CM | POA: Insufficient documentation

## 2013-05-23 DIAGNOSIS — F319 Bipolar disorder, unspecified: Secondary | ICD-10-CM | POA: Insufficient documentation

## 2013-05-23 DIAGNOSIS — F17200 Nicotine dependence, unspecified, uncomplicated: Secondary | ICD-10-CM | POA: Insufficient documentation

## 2013-05-23 DIAGNOSIS — G8929 Other chronic pain: Secondary | ICD-10-CM | POA: Insufficient documentation

## 2013-05-23 DIAGNOSIS — M25562 Pain in left knee: Secondary | ICD-10-CM

## 2013-05-23 MED ORDER — TRAMADOL HCL 50 MG PO TABS: 50.0000 mg | ORAL_TABLET | Freq: Four times a day (QID) | ORAL | Status: DC | PRN

## 2013-05-23 NOTE — Discharge Instructions (Signed)
Wear the immobilizer as needed. Use crutches as needed. Apply ice several times a day.  Tramadol tablets What is this medicine? TRAMADOL (TRA ma dole) is a pain reliever. It is used to treat moderate to severe pain in adults. This medicine may be used for other purposes; ask your health care provider or pharmacist if you have questions. COMMON BRAND NAME(S): Ultram What should I tell my health care provider before I take this medicine? They need to know if you have any of these conditions: -brain tumor -depression -drug abuse or addiction -head injury -if you frequently drink alcohol containing drinks -kidney disease or trouble passing urine -liver disease -lung disease, asthma, or breathing problems -seizures or epilepsy -suicidal thoughts, plans, or attempt; a previous suicide attempt by you or a family member -an unusual or allergic reaction to tramadol, codeine, other medicines, foods, dyes, or preservatives -pregnant or trying to get pregnant -breast-feeding How should I use this medicine? Take this medicine by mouth with a full glass of water. Follow the directions on the prescription label. If the medicine upsets your stomach, take it with food or milk. Do not take more medicine than you are told to take. Talk to your pediatrician regarding the use of this medicine in children. Special care may be needed. Overdosage: If you think you have taken too much of this medicine contact a poison control center or emergency room at once. NOTE: This medicine is only for you. Do not share this medicine with others. What if I miss a dose? If you miss a dose, take it as soon as you can. If it is almost time for your next dose, take only that dose. Do not take double or extra doses. What may interact with this medicine? Do not take this medicine with any of the following medications: -MAOIs like Carbex, Eldepryl, Marplan, Nardil, and Parnate This medicine may also interact with the following  medications: -alcohol or medicines that contain alcohol -antihistamines -benzodiazepines -bupropion -carbamazepine or oxcarbazepine -clozapine -cyclobenzaprine -digoxin -furazolidone -linezolid -medicines for depression, anxiety, or psychotic disturbances -medicines for migraine headache like almotriptan, eletriptan, frovatriptan, naratriptan, rizatriptan, sumatriptan, zolmitriptan -medicines for pain like pentazocine, buprenorphine, butorphanol, meperidine, nalbuphine, and propoxyphene -medicines for sleep -muscle relaxants -naltrexone -phenobarbital -phenothiazines like perphenazine, thioridazine, chlorpromazine, mesoridazine, fluphenazine, prochlorperazine, promazine, and trifluoperazine -procarbazine -warfarin This list may not describe all possible interactions. Give your health care provider a list of all the medicines, herbs, non-prescription drugs, or dietary supplements you use. Also tell them if you smoke, drink alcohol, or use illegal drugs. Some items may interact with your medicine. What should I watch for while using this medicine? Tell your doctor or health care professional if your pain does not go away, if it gets worse, or if you have new or a different type of pain. You may develop tolerance to the medicine. Tolerance means that you will need a higher dose of the medicine for pain relief. Tolerance is normal and is expected if you take this medicine for a long time. Do not suddenly stop taking your medicine because you may develop a severe reaction. Your body becomes used to the medicine. This does NOT mean you are addicted. Addiction is a behavior related to getting and using a drug for a non-medical reason. If you have pain, you have a medical reason to take pain medicine. Your doctor will tell you how much medicine to take. If your doctor wants you to stop the medicine, the dose will be slowly lowered over  time to avoid any side effects. You may get drowsy or dizzy. Do  not drive, use machinery, or do anything that needs mental alertness until you know how this medicine affects you. Do not stand or sit up quickly, especially if you are an older patient. This reduces the risk of dizzy or fainting spells. Alcohol can increase or decrease the effects of this medicine. Avoid alcoholic drinks. You may have constipation. Try to have a bowel movement at least every 2 to 3 days. If you do not have a bowel movement for 3 days, call your doctor or health care professional. Your mouth may get dry. Chewing sugarless gum or sucking hard candy, and drinking plenty of water may help. Contact your doctor if the problem does not go away or is severe. What side effects may I notice from receiving this medicine? Side effects that you should report to your doctor or health care professional as soon as possible: -allergic reactions like skin rash, itching or hives, swelling of the face, lips, or tongue -breathing difficulties, wheezing -confusion -itching -light headedness or fainting spells -redness, blistering, peeling or loosening of the skin, including inside the mouth -seizures Side effects that usually do not require medical attention (report to your doctor or health care professional if they continue or are bothersome): -constipation -dizziness -drowsiness -headache -nausea, vomiting This list may not describe all possible side effects. Call your doctor for medical advice about side effects. You may report side effects to FDA at 1-800-FDA-1088. Where should I keep my medicine? Keep out of the reach of children. Store at room temperature between 15 and 30 degrees C (59 and 86 degrees F). Keep container tightly closed. Throw away any unused medicine after the expiration date. NOTE: This sheet is a summary. It may not cover all possible information. If you have questions about this medicine, talk to your doctor, pharmacist, or health care provider.  2014, [employer]. (2009-09-16 11:55:44)

## 2013-05-23 NOTE — ED Notes (Signed)
Pt reports having long time knee problems. Pt states started hurting again yesterday bad.

## 2013-05-23 NOTE — ED Provider Notes (Signed)
CSN: [id number]     Arrival date & time 05/23/13  [id number] History   First MD Initiated Contact with Patient 05/23/13 0405     Chief Complaint  Patient presents with  . Knee Pain     (Consider location/radiation/quality/duration/timing/severity/associated sxs/prior Treatment) Patient is a 38 y.o. male presenting with knee pain. The history is provided by the patient.  Knee Pain He comes in with pain in his left knee since yesterday. Pain is much worse if he tries to bend his knee, better if he keeps it straight. He rates pain at 9/10 and when he bends his knee and a 3/10 when he keeps it straight out. He denies any injury in. He did have knee pain one year ago and an orthopedic doctor told him that his kneecap was slightly out of place.  Past Medical History  Diagnosis Date  . Bipolar 1 disorder   . Depression   . Schizophrenia   . Anxiety   . Chronic back pain   . Fracture, ankle    Past Surgical History  Procedure Laterality Date  . Vasectomy     Family History  Problem Relation Age of Onset  . Alcohol abuse Father   . Anxiety disorder Mother    History  Substance Use Topics  . Smoking status: Current Every Day Smoker -- 1.00 packs/day    Types: Cigarettes  . Smokeless tobacco: Not on file  . Alcohol Use: No    Review of Systems  All other systems reviewed and are negative.     Allergies  Penicillins  Home Medications   Prior to Admission medications   Medication Sig Start Date End Date Taking? Authorizing Provider  albuterol (PROAIR HFA) 108 (90 BASE) MCG/ACT inhaler Inhale 2 puffs into the lungs 4 (four) times daily as needed for wheezing or shortness of breath.   Yes Historical Provider, MD  ALPRAZolam Prudy Feeler(XANAX) 0.5 MG tablet Take 1 tablet (0.5 mg total) by mouth 3 (three) times daily as needed for sleep or anxiety. 04/18/13 04/18/14 Yes Diannia Rudereborah Ross, MD  escitalopram (LEXAPRO) 20 MG tablet Take 1 tablet (20 mg total) by mouth daily. 04/18/13  Yes Diannia Rudereborah Ross, MD   lithium carbonate (LITHOBID) 300 MG CR tablet Take 2 tablets (600 mg total) by mouth 2 (two) times daily. 04/18/13  Yes Diannia Rudereborah Ross, MD  tadalafil (CIALIS) 20 MG tablet Take 20 mg by mouth daily as needed for erectile dysfunction.   Yes Historical Provider, MD   BP 119/79  Pulse 80  Temp(Src) 98.1 F (36.7 C)  Resp 18  Ht 5\' 5"  (1.651 m)  Wt 189 lb (85.73 kg)  BMI 31.45 kg/m2  SpO2 96% Physical Exam  Nursing note and vitals reviewed.  38 year old male, resting comfortably and in no acute distress. Vital signs are normal. Oxygen saturation is 96%, which is normal. Head is normocephalic and atraumatic. PERRLA, EOMI. Oropharynx is clear. Neck is nontender and supple without adenopathy or JVD. Back is nontender and there is no CVA tenderness. Lungs are clear without rales, wheezes, or rhonchi. Chest is nontender. Heart has regular rate and rhythm without murmur. Abdomen is soft, flat, nontender without masses or hepatosplenomegaly and peristalsis is normoactive. Extremities: There is a small effusion present in the left knee. There is no tenderness to palpation no obvious instability. Pain is elicited with passive flexion of more than 10-20. Because of this, I was unable to do Lachman and McMurray's tests.. Skin is warm and dry without rash. Neurologic: Mental  status is normal, cranial nerves are intact, there are no motor or sensory deficits.  ED Course  Procedures (including critical care time) Imaging Review Dg Knee Complete 4 Views Left  05/23/2013   CLINICAL DATA:  Chronic left knee pain.  No known injury.  EXAM: LEFT KNEE - COMPLETE 4+ VIEW  COMPARISON:  04/26/2013  FINDINGS: There is no evidence of fracture, dislocation, or joint effusion. There is no evidence of arthropathy or other focal bone abnormality. Soft tissues are unremarkable.  IMPRESSION: Negative.   Electronically Signed   By: Tiburcio PeaJonathan  Watts M.D.   On: 05/23/2013 04:43   Images viewed by me.  MDM   Final  diagnoses:  Pain in left knee    Left knee pain with knee is somewhat locked in an extended position. I suspect that this is from torn meniscus. He is placed in knee immobilizer for comfort and given crutches and a prescription for tramadol for pain. He is referred back to his orthopedic physician for further evaluation and treatment.    Dione Boozeavid Maeryn Mcgath, MD 05/23/13 [phone]

## 2013-05-23 NOTE — ED Notes (Signed)
Pt alert & oriented x4, pt walking on crutches. Patient  given discharge instructions, paperwork & prescription(s). Patient verbalized understanding. Pt left department w/ no further questions.

## 2013-05-31 ENCOUNTER — Ambulatory Visit (HOSPITAL_COMMUNITY): Payer: Self-pay | Admitting: Psychology

## 2013-06-18 ENCOUNTER — Encounter (HOSPITAL_COMMUNITY): Payer: Self-pay | Admitting: Psychiatry

## 2013-06-18 ENCOUNTER — Ambulatory Visit (INDEPENDENT_AMBULATORY_CARE_PROVIDER_SITE_OTHER): Payer: Medicare Other | Admitting: Psychiatry

## 2013-06-18 VITALS — BP 108/70 | Ht 65.0 in | Wt 188.0 lb

## 2013-06-18 DIAGNOSIS — S069X4A Unspecified intracranial injury with loss of consciousness of 6 hours to 24 hours, initial encounter: Secondary | ICD-10-CM

## 2013-06-18 DIAGNOSIS — F39 Unspecified mood [affective] disorder: Secondary | ICD-10-CM

## 2013-06-18 DIAGNOSIS — F988 Other specified behavioral and emotional disorders with onset usually occurring in childhood and adolescence: Secondary | ICD-10-CM

## 2013-06-18 MED ORDER — ESCITALOPRAM OXALATE 20 MG PO TABS: 20.0000 mg | ORAL_TABLET | Freq: Every day | ORAL | Status: DC

## 2013-06-18 MED ORDER — ALPRAZOLAM 0.5 MG PO TABS: 0.5000 mg | ORAL_TABLET | Freq: Three times a day (TID) | ORAL | Status: DC | PRN

## 2013-06-18 MED ORDER — LITHIUM CARBONATE ER 300 MG PO TBCR: EXTENDED_RELEASE_TABLET | ORAL | Status: DC
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
## 2013-07-06 LAB — LITHIUM LEVEL

## 2013-07-30 ENCOUNTER — Ambulatory Visit (INDEPENDENT_AMBULATORY_CARE_PROVIDER_SITE_OTHER): Payer: Medicare Other | Admitting: Psychiatry

## 2013-07-30 ENCOUNTER — Encounter (HOSPITAL_COMMUNITY): Payer: Self-pay | Admitting: Psychiatry

## 2013-07-30 VITALS — BP 100/70 | Ht 65.0 in | Wt 184.0 lb

## 2013-07-30 DIAGNOSIS — F3130 Bipolar disorder, current episode depressed, mild or moderate severity, unspecified: Secondary | ICD-10-CM

## 2013-07-30 MED ORDER — ESCITALOPRAM OXALATE 20 MG PO TABS: 20.0000 mg | ORAL_TABLET | Freq: Every day | ORAL | Status: DC

## 2013-07-30 MED ORDER — ALPRAZOLAM 0.5 MG PO TABS: 0.5000 mg | ORAL_TABLET | Freq: Three times a day (TID) | ORAL | Status: DC | PRN

## 2013-07-30 MED ORDER — LITHIUM CARBONATE ER 300 MG PO TBCR: EXTENDED_RELEASE_TABLET | ORAL | Status: DC
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
## 2013-09-10 ENCOUNTER — Encounter (HOSPITAL_COMMUNITY): Payer: Self-pay | Admitting: Psychiatry

## 2013-09-10 ENCOUNTER — Ambulatory Visit (INDEPENDENT_AMBULATORY_CARE_PROVIDER_SITE_OTHER): Payer: Medicare Other | Admitting: Psychiatry

## 2013-09-10 VITALS — BP 110/65 | HR 57 | Ht 65.0 in | Wt 189.6 lb

## 2013-09-10 DIAGNOSIS — F39 Unspecified mood [affective] disorder: Secondary | ICD-10-CM

## 2013-09-10 DIAGNOSIS — F3130 Bipolar disorder, current episode depressed, mild or moderate severity, unspecified: Secondary | ICD-10-CM

## 2013-09-10 DIAGNOSIS — F988 Other specified behavioral and emotional disorders with onset usually occurring in childhood and adolescence: Secondary | ICD-10-CM

## 2013-09-10 MED ORDER — LITHIUM CARBONATE ER 300 MG PO TBCR: EXTENDED_RELEASE_TABLET | ORAL | Status: DC

## 2013-09-10 MED ORDER — ESCITALOPRAM OXALATE 20 MG PO TABS: 20.0000 mg | ORAL_TABLET | Freq: Every day | ORAL | Status: DC
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
## 2013-11-11 ENCOUNTER — Ambulatory Visit (HOSPITAL_COMMUNITY): Payer: Self-pay | Admitting: Psychiatry

## 2013-11-14 ENCOUNTER — Ambulatory Visit (INDEPENDENT_AMBULATORY_CARE_PROVIDER_SITE_OTHER): Payer: Medicare Other | Admitting: Psychiatry

## 2013-11-14 ENCOUNTER — Encounter (HOSPITAL_COMMUNITY): Payer: Self-pay | Admitting: Psychiatry

## 2013-11-14 VITALS — BP 122/81 | HR 75 | Ht 65.0 in | Wt 189.0 lb

## 2013-11-14 DIAGNOSIS — F3130 Bipolar disorder, current episode depressed, mild or moderate severity, unspecified: Secondary | ICD-10-CM

## 2013-11-14 DIAGNOSIS — F39 Unspecified mood [affective] disorder: Secondary | ICD-10-CM

## 2013-11-14 DIAGNOSIS — F900 Attention-deficit hyperactivity disorder, predominantly inattentive type: Secondary | ICD-10-CM

## 2013-11-14 MED ORDER — ESCITALOPRAM OXALATE 20 MG PO TABS: 20.0000 mg | ORAL_TABLET | Freq: Every day | ORAL | Status: DC

## 2013-11-14 MED ORDER — ALPRAZOLAM 0.5 MG PO TABS: 0.5000 mg | ORAL_TABLET | Freq: Three times a day (TID) | ORAL | Status: DC | PRN

## 2013-11-14 MED ORDER — LITHIUM CARBONATE ER 300 MG PO TBCR: EXTENDED_RELEASE_TABLET | ORAL | Status: DC
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
## 2013-12-19 ENCOUNTER — Telehealth (HOSPITAL_COMMUNITY): Payer: Self-pay | Admitting: *Deleted

## 2013-12-19 NOTE — Telephone Encounter (Signed)
He will need to sign a release at Dr, Cherlyn LabellaBluths office to [facility]. He has not done labs since june

## 2013-12-19 NOTE — Telephone Encounter (Signed)
Patient request his labs be sent to Dr. Loney HeringBluth.

## 2013-12-20 LAB — LITHIUM LEVEL: LITHIUM LVL: 0.1 meq/L — AB (ref 0.80–1.40)

## 2014-02-14 ENCOUNTER — Ambulatory Visit (HOSPITAL_COMMUNITY): Payer: Self-pay | Admitting: Psychiatry

## 2014-02-17 ENCOUNTER — Encounter (HOSPITAL_COMMUNITY): Payer: Self-pay | Admitting: Psychiatry

## 2014-02-17 ENCOUNTER — Ambulatory Visit (INDEPENDENT_AMBULATORY_CARE_PROVIDER_SITE_OTHER): Payer: Medicare Other | Admitting: Psychiatry

## 2014-02-17 ENCOUNTER — Other Ambulatory Visit (HOSPITAL_COMMUNITY): Payer: Self-pay | Admitting: Psychiatry

## 2014-02-17 VITALS — BP 113/67 | HR 69 | Ht 65.0 in | Wt 185.6 lb

## 2014-02-17 DIAGNOSIS — F900 Attention-deficit hyperactivity disorder, predominantly inattentive type: Secondary | ICD-10-CM

## 2014-02-17 DIAGNOSIS — F3130 Bipolar disorder, current episode depressed, mild or moderate severity, unspecified: Secondary | ICD-10-CM

## 2014-02-17 DIAGNOSIS — F39 Unspecified mood [affective] disorder: Secondary | ICD-10-CM

## 2014-02-17 MED ORDER — LITHIUM CARBONATE ER 300 MG PO TBCR: EXTENDED_RELEASE_TABLET | ORAL | Status: DC

## 2014-02-17 MED ORDER — ESCITALOPRAM OXALATE 20 MG PO TABS: 20.0000 mg | ORAL_TABLET | Freq: Every day | ORAL | Status: DC

## 2014-02-17 MED ORDER — ALPRAZOLAM 0.5 MG PO TABS: 0.5000 mg | ORAL_TABLET | Freq: Three times a day (TID) | ORAL | Status: DC | PRN
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
## 2014-04-26 ENCOUNTER — Emergency Department (HOSPITAL_COMMUNITY): Payer: Medicare Other

## 2014-04-26 ENCOUNTER — Emergency Department (HOSPITAL_COMMUNITY)
Admission: EM | Admit: 2014-04-26 | Discharge: 2014-04-26 | Disposition: A | Discharge status: Home / Self Care | Payer: Medicare Other | Attending: Emergency Medicine | Admitting: Emergency Medicine

## 2014-04-26 ENCOUNTER — Encounter (HOSPITAL_COMMUNITY): Payer: Self-pay | Admitting: *Deleted

## 2014-04-26 DIAGNOSIS — R0789 Other chest pain: Secondary | ICD-10-CM | POA: Insufficient documentation

## 2014-04-26 DIAGNOSIS — F419 Anxiety disorder, unspecified: Secondary | ICD-10-CM | POA: Insufficient documentation

## 2014-04-26 DIAGNOSIS — R079 Chest pain, unspecified: Secondary | ICD-10-CM | POA: Diagnosis present

## 2014-04-26 DIAGNOSIS — F209 Schizophrenia, unspecified: Secondary | ICD-10-CM | POA: Diagnosis not present

## 2014-04-26 DIAGNOSIS — R0602 Shortness of breath: Secondary | ICD-10-CM | POA: Insufficient documentation

## 2014-04-26 DIAGNOSIS — F319 Bipolar disorder, unspecified: Secondary | ICD-10-CM | POA: Insufficient documentation

## 2014-04-26 DIAGNOSIS — Z8781 Personal history of (healed) traumatic fracture: Secondary | ICD-10-CM | POA: Insufficient documentation

## 2014-04-26 DIAGNOSIS — Z880 Allergy status to penicillin: Secondary | ICD-10-CM | POA: Insufficient documentation

## 2014-04-26 DIAGNOSIS — G8929 Other chronic pain: Secondary | ICD-10-CM | POA: Diagnosis not present

## 2014-04-26 DIAGNOSIS — M549 Dorsalgia, unspecified: Secondary | ICD-10-CM | POA: Diagnosis not present

## 2014-04-26 DIAGNOSIS — Z79899 Other long term (current) drug therapy: Secondary | ICD-10-CM | POA: Insufficient documentation

## 2014-04-26 LAB — CBC WITH DIFFERENTIAL/PLATELET
BASOS ABS: 0 10*3/uL (ref 0.0–0.1)
Basophils Relative: 0 % (ref 0–1)
EOS ABS: 0.1 10*3/uL (ref 0.0–0.7)
Eosinophils Relative: 0 % (ref 0–5)
HEMATOCRIT: 48.1 % (ref 39.0–52.0)
Hemoglobin: 16.8 g/dL (ref 13.0–17.0)
Lymphocytes Relative: 21 % (ref 12–46)
Lymphs Abs: 3 10*3/uL (ref 0.7–4.0)
MCH: 32.1 pg (ref 26.0–34.0)
MCHC: 34.9 g/dL (ref 30.0–36.0)
MCV: 92 fL (ref 78.0–100.0)
Monocytes Absolute: 0.9 10*3/uL (ref 0.1–1.0)
Monocytes Relative: 6 % (ref 3–12)
Neutro Abs: 10.4 10*3/uL — ABNORMAL HIGH (ref 1.7–7.7)
Neutrophils Relative %: 73 % (ref 43–77)
Platelets: 250 10*3/uL (ref 150–400)
RBC: 5.23 MIL/uL (ref 4.22–5.81)
RDW: 13.9 % (ref 11.5–15.5)
WBC: 14.5 10*3/uL — AB (ref 4.0–10.5)

## 2014-04-26 LAB — BASIC METABOLIC PANEL
Anion gap: 8 (ref 5–15)
BUN: 6 mg/dL (ref 6–23)
CHLORIDE: 107 mmol/L (ref 96–112)
CO2: 23 mmol/L (ref 19–32)
Calcium: 9.3 mg/dL (ref 8.4–10.5)
Creatinine, Ser: 0.71 mg/dL (ref 0.50–1.35)
Glucose, Bld: 113 mg/dL — ABNORMAL HIGH (ref 70–99)
Potassium: 3.6 mmol/L (ref 3.5–5.1)
Sodium: 138 mmol/L (ref 135–145)

## 2014-04-26 LAB — D-DIMER, QUANTITATIVE (NOT AT ARMC)

## 2014-04-26 LAB — TROPONIN I: Troponin I: <0.03 ng/mL (ref <0.031)

## 2014-04-26 MED ORDER — KETOROLAC TROMETHAMINE 30 MG/ML IJ SOLN
30.0000 mg | Freq: Once | INTRAMUSCULAR | Status: AC
  Administered 2014-04-26: 30 mg via INTRAVENOUS
  Filled 2014-04-26: qty 1

## 2014-04-26 NOTE — Discharge Instructions (Signed)

## 2014-04-26 NOTE — ED Notes (Signed)
Patient with no complaints at this time. Respirations even and unlabored. Skin warm/dry. Discharge instructions reviewed with patient at this time. Patient given opportunity to voice concerns/ask questions. IV removed per policy and band-aid applied to site. Patient discharged at this time and left Emergency Department with steady gait.  

## 2014-04-26 NOTE — ED Provider Notes (Signed)
CSN: [id number]     Arrival date & time 04/26/14  1910 History   First MD Initiated Contact with Patient 04/26/14 1914     Chief Complaint  Patient presents with  . Chest Pain     (Consider location/radiation/quality/duration/timing/severity/associated sxs/prior Treatment) HPI  39 year old male presents with chest pain for the past 1-2 days. He states the pain is just right of his sternum. Worsens when walking and also with inspiration. It is a sharp stabbing pain. Occasionally he becomes short of breath. The pain has been constant for the last couple days. He has not taken anything for pain because he does not know what would interact with his current medicines. The patient has not had any fevers or cough. No leg swelling, history of DVT, or hemoptysis. The patient has not heard any wheezing. No recent travel. Patient states he felt similar symptoms a couple months ago that he attributed all to stress. Rates his pain as a 4-5/10.  Past Medical History  Diagnosis Date  . Bipolar 1 disorder   . Depression   . Schizophrenia   . Anxiety   . Chronic back pain   . Fracture, ankle    Past Surgical History  Procedure Laterality Date  . Vasectomy     Family History  Problem Relation Age of Onset  . Alcohol abuse Father   . Anxiety disorder Mother    History  Substance Use Topics  . Smoking status: Current Every Day Smoker -- 1.00 packs/day    Types: Cigarettes  . Smokeless tobacco: Not on file  . Alcohol Use: No    Review of Systems  Constitutional: Negative for fever, chills and diaphoresis.  Respiratory: Positive for shortness of breath. Negative for cough.   Cardiovascular: Positive for chest pain. Negative for leg swelling.  Gastrointestinal: Negative for nausea, vomiting and abdominal pain.  Musculoskeletal: Positive for back pain (chronic, unchanged with these symptoms).  All other systems reviewed and are negative.     Allergies  Penicillins  Home Medications    Prior to Admission medications   Medication Sig Start Date End Date Taking? Authorizing Provider  albuterol (PROAIR HFA) 108 (90 BASE) MCG/ACT inhaler Inhale 2 puffs into the lungs 4 (four) times daily as needed for wheezing or shortness of breath.    Historical Provider, MD  ALPRAZolam Prudy Feeler(XANAX) 0.5 MG tablet Take 1 tablet (0.5 mg total) by mouth 3 (three) times daily as needed for sleep or anxiety. 02/17/14 02/17/15  Myrlene Brokereborah R Ross, MD  escitalopram (LEXAPRO) 20 MG tablet Take 1 tablet (20 mg total) by mouth daily. 02/17/14   Myrlene Brokereborah R Ross, MD  lithium carbonate (LITHOBID) 300 MG CR tablet Take two tablets three times a day 02/17/14   Myrlene Brokereborah R Ross, MD   BP 125/91 mmHg  Pulse 76  Temp(Src) 98.2 F (36.8 C) (Oral)  Resp 14  Ht 5\' 5"  (1.651 m)  Wt 180 lb (81.647 kg)  BMI 29.95 kg/m2  SpO2 96% Physical Exam  Constitutional: He is oriented to person, place, and time. He appears well-developed and well-nourished.  HENT:  Head: Normocephalic and atraumatic.  Right Ear: External ear normal.  Left Ear: External ear normal.  Nose: Nose normal.  Eyes: Right eye exhibits no discharge. Left eye exhibits no discharge.  Neck: Neck supple.  Cardiovascular: Normal rate, regular rhythm, normal heart sounds and intact distal pulses.   No murmur heard. Pulses:      Radial pulses are 2+ on the right side, and 2+ on  the left side.  Pulmonary/Chest: Effort normal. He exhibits tenderness (tender to lateral aspect of sternum on the right).  Abdominal: Soft. He exhibits no distension. There is no tenderness.  Musculoskeletal: He exhibits no edema.  Neurological: He is alert and oriented to person, place, and time.  Skin: Skin is warm and dry. No rash noted.  Nursing note and vitals reviewed.   ED Course  Procedures (including critical care time) Labs Review Labs Reviewed  CBC WITH DIFFERENTIAL/PLATELET - Abnormal; Notable for the following:    WBC 14.5 (*)    Neutro Abs 10.4 (*)    All other  components within normal limits  BASIC METABOLIC PANEL - Abnormal; Notable for the following:    Glucose, Bld 113 (*)    All other components within normal limits  TROPONIN I  D-DIMER, QUANTITATIVE    Imaging Review Dg Chest 2 View  04/26/2014   CLINICAL DATA:  Chest pain for 3 days.  Shortness of breath  EXAM: CHEST  2 VIEW  COMPARISON:  07/06/2012  FINDINGS: There is moderate cardiac enlargement. No pleural effusion or edema identified. No airspace consolidation identified. Asymmetric elevation of the right hemidiaphragm noted. The visualized osseous structures are unremarkable.  IMPRESSION: 1. No acute cardiopulmonary abnormalities. 2. Cardiac enlargement and asymmetric elevation of the right hemidiaphragm   Electronically Signed   By: Signa Kell M.D.   On: 04/26/2014 20:41     EKG Interpretation   Date/Time:  Saturday April 26 2014 19:25:06 EDT Ventricular Rate:  80 PR Interval:  216 QRS Duration: 104 QT Interval:  365 QTC Calculation: 421 R Axis:   48 Text Interpretation:  Normal sinus rhythm Prolonged PR interval ST elev,  probable normal early repol pattern No significant change since 2014  Confirmed by Temia Debroux  MD, Judson Tsan ([id number]) on 04/26/2014 7:27:59 PM      MDM   Final diagnoses:  Chest wall pain    Patient feels better after a dose of IV Toradol. Patient has a benign EKG and a normal troponin. States he's been having continuous chest pain for 24+ hours. Thus a do not feel second troponin is indicated in this low risk patient. I highly doubt ACS. Patient is low risk for pulmonary embolus him, given his pleuritic symptoms a d-dimer was sent but is negative. I feel PE is ruled out. Likely this is a chest wall inflammation, will recommend Tylenol given that because he is on lithium he should not be having multiple doses of NSAIDs. DC home to follow-up with PCP.    Pricilla Loveless, MD 04/26/14 2103

## 2014-04-26 NOTE — ED Notes (Signed)
Pt states he has been sob x 1 day. Pt states when he takes a deep breath is when his chest really hurts.

## 2014-04-27 ENCOUNTER — Encounter (HOSPITAL_COMMUNITY): Payer: Self-pay | Admitting: *Deleted

## 2014-04-27 ENCOUNTER — Emergency Department (HOSPITAL_COMMUNITY)
Admission: EM | Admit: 2014-04-27 | Discharge: 2014-04-28 | Disposition: A | Discharge status: Other Acute Inpatient Hospital | Payer: Medicare Other | Attending: Emergency Medicine | Admitting: Emergency Medicine

## 2014-04-27 ENCOUNTER — Encounter (HOSPITAL_COMMUNITY): Payer: Self-pay | Admitting: Emergency Medicine

## 2014-04-27 ENCOUNTER — Emergency Department (HOSPITAL_COMMUNITY)
Admission: EM | Admit: 2014-04-27 | Discharge: 2014-04-27 | Discharge status: Against Medical Advice | Payer: Medicare Other | Attending: Emergency Medicine | Admitting: Emergency Medicine

## 2014-04-27 DIAGNOSIS — F329 Major depressive disorder, single episode, unspecified: Secondary | ICD-10-CM

## 2014-04-27 DIAGNOSIS — R45851 Suicidal ideations: Secondary | ICD-10-CM | POA: Diagnosis present

## 2014-04-27 DIAGNOSIS — G8929 Other chronic pain: Secondary | ICD-10-CM | POA: Insufficient documentation

## 2014-04-27 DIAGNOSIS — Z880 Allergy status to penicillin: Secondary | ICD-10-CM | POA: Diagnosis not present

## 2014-04-27 DIAGNOSIS — R079 Chest pain, unspecified: Secondary | ICD-10-CM | POA: Insufficient documentation

## 2014-04-27 DIAGNOSIS — F32A Depression, unspecified: Secondary | ICD-10-CM

## 2014-04-27 DIAGNOSIS — F209 Schizophrenia, unspecified: Secondary | ICD-10-CM | POA: Insufficient documentation

## 2014-04-27 DIAGNOSIS — F319 Bipolar disorder, unspecified: Secondary | ICD-10-CM | POA: Insufficient documentation

## 2014-04-27 DIAGNOSIS — Z720 Tobacco use: Secondary | ICD-10-CM | POA: Insufficient documentation

## 2014-04-27 DIAGNOSIS — F419 Anxiety disorder, unspecified: Secondary | ICD-10-CM | POA: Diagnosis not present

## 2014-04-27 DIAGNOSIS — Z79899 Other long term (current) drug therapy: Secondary | ICD-10-CM | POA: Insufficient documentation

## 2014-04-27 LAB — LITHIUM LEVEL

## 2014-04-27 LAB — COMPREHENSIVE METABOLIC PANEL
ALBUMIN: 3.9 g/dL (ref 3.5–5.2)
ALK PHOS: 111 U/L (ref 39–117)
ALT: 21 U/L (ref 0–53)
ANION GAP: 9 (ref 5–15)
AST: 18 U/L (ref 0–37)
BUN: 7 mg/dL (ref 6–23)
CO2: 23 mmol/L (ref 19–32)
Calcium: 9.3 mg/dL (ref 8.4–10.5)
Chloride: 105 mmol/L (ref 96–112)
Creatinine, Ser: 0.78 mg/dL (ref 0.50–1.35)
GFR calc Af Amer: >90 mL/min (ref >90)
GFR calc non Af Amer: >90 mL/min (ref >90)
GLUCOSE: 94 mg/dL (ref 70–99)
Potassium: 3.6 mmol/L (ref 3.5–5.1)
Sodium: 137 mmol/L (ref 135–145)
TOTAL PROTEIN: 7.3 g/dL (ref 6.0–8.3)
Total Bilirubin: 0.8 mg/dL (ref 0.3–1.2)

## 2014-04-27 LAB — CBC WITH DIFFERENTIAL/PLATELET
BASOS ABS: 0 10*3/uL (ref 0.0–0.1)
Basophils Relative: 0 % (ref 0–1)
Eosinophils Absolute: 0 10*3/uL (ref 0.0–0.7)
Eosinophils Relative: 0 % (ref 0–5)
HCT: 48.3 % (ref 39.0–52.0)
Hemoglobin: 16.3 g/dL (ref 13.0–17.0)
LYMPHS ABS: 2.5 10*3/uL (ref 0.7–4.0)
Lymphocytes Relative: 21 % (ref 12–46)
MCH: 31.3 pg (ref 26.0–34.0)
MCHC: 33.7 g/dL (ref 30.0–36.0)
MCV: 92.7 fL (ref 78.0–100.0)
Monocytes Absolute: 0.8 10*3/uL (ref 0.1–1.0)
Monocytes Relative: 7 % (ref 3–12)
NEUTROS PCT: 72 % (ref 43–77)
Neutro Abs: 8.5 10*3/uL — ABNORMAL HIGH (ref 1.7–7.7)
Platelets: 259 10*3/uL (ref 150–400)
RBC: 5.21 MIL/uL (ref 4.22–5.81)
RDW: 14 % (ref 11.5–15.5)
WBC: 11.9 10*3/uL — ABNORMAL HIGH (ref 4.0–10.5)

## 2014-04-27 LAB — RAPID URINE DRUG SCREEN, HOSP PERFORMED
Amphetamines: NOT DETECTED
BENZODIAZEPINES: NOT DETECTED
Barbiturates: NOT DETECTED
COCAINE: NOT DETECTED
OPIATES: NOT DETECTED
TETRAHYDROCANNABINOL: NOT DETECTED

## 2014-04-27 LAB — TROPONIN I: Troponin I: <0.03 ng/mL (ref <0.031)

## 2014-04-27 LAB — ETHANOL: Alcohol, Ethyl (B): <5 mg/dL (ref 0–9)

## 2014-04-27 LAB — SALICYLATE LEVEL: Salicylate Lvl: <4 mg/dL (ref 2.8–20.0)

## 2014-04-27 LAB — ACETAMINOPHEN LEVEL

## 2014-04-27 MED ORDER — ALPRAZOLAM 0.5 MG PO TABS: 0.5000 mg | ORAL_TABLET | Freq: Three times a day (TID) | ORAL | Status: DC | PRN

## 2014-04-27 MED ORDER — ALBUTEROL SULFATE HFA 108 (90 BASE) MCG/ACT IN AERS: 2.0000 | INHALATION_SPRAY | Freq: Four times a day (QID) | RESPIRATORY_TRACT | Status: DC | PRN

## 2014-04-27 MED ORDER — ESCITALOPRAM OXALATE 10 MG PO TABS
20.0000 mg | ORAL_TABLET | Freq: Every day | ORAL | Status: DC
  Administered 2014-04-28: 20 mg via ORAL
  Filled 2014-04-27 (×2): qty 2

## 2014-04-27 NOTE — ED Notes (Signed)
Pt brought in by [facility] for c/o suicide; pt states he wants to cut his wrist; pt states he has a lot of things going on in his personal life with his family and children that have made him want to hurt himself

## 2014-04-27 NOTE — ED Provider Notes (Signed)
CSN: [id number]     Arrival date & time 04/27/14  1903 History   First MD Initiated Contact with Patient 04/27/14 2029     Chief Complaint  Patient presents with  . Suicidal     (Consider location/radiation/quality/duration/timing/severity/associated sxs/prior Treatment) HPI 39 year old male presents in police custody after family called because patient was having suicidal thoughts. He has not attempted suicide. The patient's attempted suicide several years ago when he was a teenager by cutting his wrist, states that is how he would try to kill himself today. He states a lot of things are going on in his life including recently breaking up with his significant other and not being able to see his kids. The patient's was seen here yesterday for chest pain. I saw him for this and he made no mention of depression or suicidal thoughts. He states this is been going on "a while". Continues to have chest pain, states it is constant and unchanged since yesterday.  Past Medical History  Diagnosis Date  . Bipolar 1 disorder   . Depression   . Schizophrenia   . Anxiety   . Chronic back pain   . Fracture, ankle    Past Surgical History  Procedure Laterality Date  . Vasectomy     Family History  Problem Relation Age of Onset  . Alcohol abuse Father   . Anxiety disorder Mother   . Heart attack Mother    History  Substance Use Topics  . Smoking status: Current Every Day Smoker -- 2.00 packs/day for 25 years    Types: Cigarettes  . Smokeless tobacco: Never Used  . Alcohol Use: No    Review of Systems  Constitutional: Negative for fever and chills.  Cardiovascular: Positive for chest pain.  Gastrointestinal: Negative for vomiting.  Psychiatric/Behavioral: Positive for suicidal ideas and dysphoric mood. Negative for self-injury.  All other systems reviewed and are negative.     Allergies  Penicillins  Home Medications   Prior to Admission medications   Medication Sig Start Date  End Date Taking? Authorizing Provider  albuterol (PROAIR HFA) 108 (90 BASE) MCG/ACT inhaler Inhale 2 puffs into the lungs 4 (four) times daily as needed for wheezing or shortness of breath.    Historical Provider, MD  ALPRAZolam Prudy Feeler) 0.5 MG tablet Take 1 tablet (0.5 mg total) by mouth 3 (three) times daily as needed for sleep or anxiety. 02/17/14 02/17/15  Myrlene Broker, MD  escitalopram (LEXAPRO) 20 MG tablet Take 1 tablet (20 mg total) by mouth daily. 02/17/14   Myrlene Broker, MD  lithium carbonate (LITHOBID) 300 MG CR tablet Take two tablets three times a day 02/17/14   Myrlene Broker, MD   BP 129/92 mmHg  Pulse 86  Temp(Src) 98.9 F (37.2 C) (Oral)  Resp 18  Ht 5' 5.5" (1.664 m)  Wt 180 lb (81.647 kg)  BMI 29.49 kg/m2  SpO2 97% Physical Exam  Constitutional: He is oriented to person, place, and time. He appears well-developed and well-nourished.  HENT:  Head: Normocephalic and atraumatic.  Right Ear: External ear normal.  Left Ear: External ear normal.  Nose: Nose normal.  Eyes: Right eye exhibits no discharge. Left eye exhibits no discharge.  Neck: Neck supple.  Cardiovascular: Normal rate, regular rhythm, normal heart sounds and intact distal pulses.   Pulmonary/Chest: Effort normal and breath sounds normal. He exhibits tenderness.  Abdominal: Soft. He exhibits no distension. There is no tenderness.  Musculoskeletal: He exhibits no edema.  Neurological: He  is alert and oriented to person, place, and time.  Skin: Skin is warm and dry.  Psychiatric: He expresses suicidal ideation. He expresses no homicidal ideation. He expresses suicidal plans. He expresses no homicidal plans.  Flat affect  Nursing note and vitals reviewed.   ED Course  Procedures (including critical care time) Labs Review Labs Reviewed  ACETAMINOPHEN LEVEL  COMPREHENSIVE METABOLIC PANEL  ETHANOL  SALICYLATE LEVEL  TROPONIN I  CBC WITH DIFFERENTIAL/PLATELET  URINE RAPID DRUG SCREEN (HOSP PERFORMED)   LITHIUM LEVEL    Imaging Review Dg Chest 2 View  04/26/2014   CLINICAL DATA:  Chest pain for 3 days.  Shortness of breath  EXAM: CHEST  2 VIEW  COMPARISON:  07/06/2012  FINDINGS: There is moderate cardiac enlargement. No pleural effusion or edema identified. No airspace consolidation identified. Asymmetric elevation of the right hemidiaphragm noted. The visualized osseous structures are unremarkable.  IMPRESSION: 1. No acute cardiopulmonary abnormalities. 2. Cardiac enlargement and asymmetric elevation of the right hemidiaphragm   Electronically Signed   By: Signa Kellaylor  Stroud M.D.   On: 04/26/2014 20:41     EKG Interpretation   Date/Time:  Sunday April 27 2014 20:50:36 EDT Ventricular Rate:  70 PR Interval:  214 QRS Duration: 106 QT Interval:  396 QTC Calculation: 427 R Axis:   115 Text Interpretation:  Sinus rhythm Prolonged PR interval Consider left  atrial enlargement ST elevation suggests acute pericarditis Baseline  wander in lead(s) V6 no significant change from yesterday Confirmed by  Ervin Rothbauer  MD, Maryan Sivak ([id number]) on 04/27/2014 8:54:49 PM      MDM   Final diagnoses:  Depression    Patient with depression and suicidal thoughts. Patient is well-appearing at this time and stable. Is not acutely psychotic. Will need medical screening labs as well as psychiatry consult. I feel his chest pain is likely chest wall in etiology. We'll give Tylenol when necessary for pain. Care transferred oncoming emergency physician with dyspnea pending per psych.    Pricilla LovelessScott Dhana Totton, MD 04/27/14 2147
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
## 2014-04-27 NOTE — B[facility] Counselor (Signed)
Joann Glover, AC at [facility], confirmed adult unit is currently at capacity. Contacted the following facilities for placement:  AT CAPACITY: [facility], per Margaret [facility], per Jennifer [facility], per [facility], per [facility], per [facility], per [facility], per [facility], per [facility], per [facility], per [facility], per Chris [facility], per [facility], per [facility], per [facility], per [facility]ad[facility], per Jerome [facility], per [facility], per [facility], per [facility], per [facility], per [facility], per Jonah   [place] Haydon Ellis Johnasia Liese Jr, LPC, [facility] Triage Specialist [phone]  

## 2014-04-27 NOTE — ED Notes (Signed)
Patient c/o mid-sternal chest pain that radiates into left arm. Reports SOB, dizziness, and back pain. Patient evaluated here in ED for pain. Per patient was not told what diagnoses was. Patient had chest x-ray, ekg, labs, and toradol. Diagnosis per discharge was chest wall pain. Denies taking any medication for pain.

## 2014-04-27 NOTE — ED Notes (Signed)
Pt meets criteria for inpatient per [facility], no beds available at [facility], waiting for placement

## 2014-04-28 MED ORDER — NICOTINE 21 MG/24HR TD PT24
21.0000 mg | MEDICATED_PATCH | Freq: Once | TRANSDERMAL | Status: DC
  Administered 2014-04-28: 21 mg via TRANSDERMAL
  Filled 2014-04-28: qty 1

## 2014-04-28 NOTE — ED Provider Notes (Signed)
Nursing staff states IVC papers were never done on patient. Patient has been evaluated by [facility] who feel patient needs inpatient treatment for his depression with suicidal ideation, specifically threatening to cut his wrists. Patient has a prior suicide attempt when he was a teenager by cutting his wrists.  Patient is currently sleeping.  IVC papers were filled out by myself.  Devoria AlbeIva Rowynn Mcweeney, MD, Concha PyoFACEP   Gloriana Piltz, MD 04/28/14 [phone]

## 2014-04-28 NOTE — Progress Notes (Signed)
CSW sought placement at the following facilities:  [facility]- Claris Che[facility]aret, no d/cs as of yet, check back later in the day for bed availability [facility]- left voicemail [facility]- [facility], at capacity 4/11 [facility], gero beds only today 4/11 [facility], male bed only as of yet today 4/1. May check back later to inquire about male beds  Fax[facility]: [facility]- per Andrey CampanileS[facility], possible bed availability and intake will be reviewing referrals today [facility]- per Leta Spelleriane [facility]- [facility]- Per Fidela [facility]- intake staff in meeting, other rep states send referrals for their review   Ilean SkillMeghan Markese Bloxham, MSW, [facility]mondCSWA Clinical Social Work, Disposition  04/28/2014 [phone]

## 2014-04-28 NOTE — ED Provider Notes (Signed)
[name].  [place] transfer paperwork completed.  Craig Batonourtney F Othar Curto, MD 04/28/14 [phone]

## 2014-04-28 NOTE — ED Notes (Signed)
Informed pt that more than likely he would be here all night and they are seeking placement for him, but no result this far. Pt upset wants to smoke and I offered a nicotine patch but he denied. Pt is currently shackled to bed by handcuffs.

## 2014-04-28 NOTE — Progress Notes (Signed)
[facility] Mor[facility]Ashley [facility], pt is added to waitlist. Will call when bed comes available.  Ilean SkillMeghan Sonya Pucci, MSW, LCSWA Clinical Social Work, Disposition  04/28/2014 [phone]

## 2014-04-28 NOTE — Progress Notes (Signed)
Per Broadus JohnWarren, [facility] by Dr. Joseph ArtSubedi. Report [phone].   Spoke with [facility] RN Darel HongJudy regarding pt's disposition.  Ilean SkillMeghan Zandyr Barnhill, MSW, LCSWA Clinical Social Work, Disposition  04/28/2014 [phone]

## 2014-05-09 ENCOUNTER — Encounter (HOSPITAL_COMMUNITY): Payer: Self-pay | Admitting: Psychiatry

## 2014-05-09 ENCOUNTER — Ambulatory Visit (HOSPITAL_COMMUNITY): Payer: Self-pay | Admitting: Psychiatry

## 2014-05-19 ENCOUNTER — Ambulatory Visit (HOSPITAL_COMMUNITY): Payer: Self-pay | Admitting: Psychiatry

## 2014-05-20 ENCOUNTER — Ambulatory Visit (INDEPENDENT_AMBULATORY_CARE_PROVIDER_SITE_OTHER): Payer: Medicare Other | Admitting: Psychiatry

## 2014-05-20 ENCOUNTER — Encounter (HOSPITAL_COMMUNITY): Payer: Self-pay | Admitting: Psychiatry

## 2014-05-20 VITALS — BP 122/70 | HR 70 | Ht 65.5 in | Wt 180.0 lb

## 2014-05-20 DIAGNOSIS — F39 Unspecified mood [affective] disorder: Secondary | ICD-10-CM | POA: Diagnosis not present

## 2014-05-20 DIAGNOSIS — F900 Attention-deficit hyperactivity disorder, predominantly inattentive type: Secondary | ICD-10-CM

## 2014-05-20 DIAGNOSIS — F3130 Bipolar disorder, current episode depressed, mild or moderate severity, unspecified: Secondary | ICD-10-CM

## 2014-05-20 MED ORDER — LITHIUM CARBONATE 300 MG PO TABS: 900.0000 mg | ORAL_TABLET | Freq: Two times a day (BID) | ORAL | Status: DC

## 2014-05-20 MED ORDER — ALPRAZOLAM 1 MG PO TABS: 1.0000 mg | ORAL_TABLET | Freq: Three times a day (TID) | ORAL | Status: DC

## 2014-05-20 MED ORDER — ESCITALOPRAM OXALATE 20 MG PO TABS: 20.0000 mg | ORAL_TABLET | Freq: Every day | ORAL | Status: DC
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
## 2014-05-26 ENCOUNTER — Emergency Department (HOSPITAL_COMMUNITY): Payer: Medicare Other

## 2014-05-26 ENCOUNTER — Encounter (HOSPITAL_COMMUNITY): Payer: Self-pay | Admitting: Intensive Care

## 2014-05-26 ENCOUNTER — Emergency Department (HOSPITAL_COMMUNITY)
Admission: EM | Admit: 2014-05-26 | Discharge: 2014-05-26 | Disposition: A | Discharge status: Home / Self Care | Payer: Medicare Other | Attending: Emergency Medicine | Admitting: Emergency Medicine

## 2014-05-26 DIAGNOSIS — S62336A Displaced fracture of neck of fifth metacarpal bone, right hand, initial encounter for closed fracture: Secondary | ICD-10-CM | POA: Insufficient documentation

## 2014-05-26 DIAGNOSIS — Y9389 Activity, other specified: Secondary | ICD-10-CM | POA: Insufficient documentation

## 2014-05-26 DIAGNOSIS — Z720 Tobacco use: Secondary | ICD-10-CM | POA: Insufficient documentation

## 2014-05-26 DIAGNOSIS — F319 Bipolar disorder, unspecified: Secondary | ICD-10-CM | POA: Diagnosis not present

## 2014-05-26 DIAGNOSIS — F419 Anxiety disorder, unspecified: Secondary | ICD-10-CM | POA: Insufficient documentation

## 2014-05-26 DIAGNOSIS — Y998 Other external cause status: Secondary | ICD-10-CM | POA: Diagnosis not present

## 2014-05-26 DIAGNOSIS — S62339A Displaced fracture of neck of unspecified metacarpal bone, initial encounter for closed fracture: Secondary | ICD-10-CM

## 2014-05-26 DIAGNOSIS — Z880 Allergy status to penicillin: Secondary | ICD-10-CM | POA: Diagnosis not present

## 2014-05-26 DIAGNOSIS — Y9289 Other specified places as the place of occurrence of the external cause: Secondary | ICD-10-CM | POA: Insufficient documentation

## 2014-05-26 DIAGNOSIS — S6991XA Unspecified injury of right wrist, hand and finger(s), initial encounter: Secondary | ICD-10-CM | POA: Diagnosis present

## 2014-05-26 DIAGNOSIS — F209 Schizophrenia, unspecified: Secondary | ICD-10-CM | POA: Insufficient documentation

## 2014-05-26 DIAGNOSIS — W228XXA Striking against or struck by other objects, initial encounter: Secondary | ICD-10-CM | POA: Diagnosis not present

## 2014-05-26 DIAGNOSIS — G8929 Other chronic pain: Secondary | ICD-10-CM | POA: Insufficient documentation

## 2014-05-26 MED ORDER — LIDOCAINE HCL (PF) 1 % IJ SOLN
INTRAMUSCULAR | Status: AC
Start: 2014-05-26 — End: 2014-05-26
  Administered 2014-05-26: 5 mL
  Filled 2014-05-26: qty 5

## 2014-05-26 MED ORDER — HYDROCODONE-ACETAMINOPHEN 5-325 MG PO TABS: 1.0000 | ORAL_TABLET | ORAL | Status: DC | PRN
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
## 2014-05-26 NOTE — Discharge Instructions (Signed)
Boxer's Fracture You have a break (fracture) of the fifth metacarpal bone. This is commonly called a boxer's fracture. This is the bone in the hand where the little finger attaches. The fracture is in the end of that bone, closest to the little finger. It is usually caused when you hit an object with a clenched fist. Often, the knuckle is pushed down by the impact. Sometimes, the fracture rotates out of position. A boxer's fracture will usually heal within 6 weeks, if it is treated properly and protected from re-injury. Surgery is sometimes needed. A cast, splint, or bulky hand dressing may be used to protect and immobilize a boxer's fracture. Do not remove this device or dressing until your caregiver approves. Keep your hand elevated, and apply ice packs for 15-20 minutes every 2 hours, for the first 2 days. Elevation and ice help reduce swelling and relieve pain. See your caregiver, or an orthopedic specialist, for follow-up care within the next 10 days. This is to make sure your fracture is healing properly. Document Released: 01/03/2005 Document Revised: 03/28/2011 Document Reviewed: 06/23/2006 [place] Patient Information 2015 [place], [place]. This information is not intended to replace advice given to you by your health care provider. Make sure you discuss any questions you have with your health care provider.    You may take the hydrocodone prescribed for pain relief.  This will make you drowsy - do not drive within 4 hours of taking this medication.

## 2014-05-29 ENCOUNTER — Ambulatory Visit (INDEPENDENT_AMBULATORY_CARE_PROVIDER_SITE_OTHER): Payer: Medicare Other | Admitting: Orthopedic Surgery

## 2014-05-29 ENCOUNTER — Encounter: Payer: Self-pay | Admitting: Orthopedic Surgery

## 2014-05-29 VITALS — BP 116/70 | Ht 65.5 in | Wt 180.0 lb

## 2014-05-29 DIAGNOSIS — S62306A Unspecified fracture of fifth metacarpal bone, right hand, initial encounter for closed fracture: Secondary | ICD-10-CM | POA: Diagnosis not present

## 2014-05-29 DIAGNOSIS — S62309A Unspecified fracture of unspecified metacarpal bone, initial encounter for closed fracture: Secondary | ICD-10-CM

## 2014-05-29 MED ORDER — HYDROCODONE-ACETAMINOPHEN 5-325 MG PO TABS: 1.0000 | ORAL_TABLET | ORAL | Status: DC | PRN
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
## 2014-06-05 LAB — LITHIUM LEVEL: Lithium Lvl: 0.7 mEq/L — ABNORMAL LOW (ref 0.80–1.40)

## 2014-06-26 ENCOUNTER — Encounter: Payer: Self-pay | Admitting: Orthopedic Surgery

## 2014-06-26 ENCOUNTER — Ambulatory Visit (INDEPENDENT_AMBULATORY_CARE_PROVIDER_SITE_OTHER): Payer: Medicare Other

## 2014-06-26 ENCOUNTER — Ambulatory Visit (INDEPENDENT_AMBULATORY_CARE_PROVIDER_SITE_OTHER): Payer: Self-pay | Admitting: Orthopedic Surgery

## 2014-06-26 VITALS — BP 116/76 | Ht 65.5 in | Wt 180.0 lb

## 2014-06-26 DIAGNOSIS — S6291XD Unspecified fracture of right wrist and hand, subsequent encounter for fracture with routine healing: Secondary | ICD-10-CM

## 2014-06-26 DIAGNOSIS — S62309A Unspecified fracture of unspecified metacarpal bone, initial encounter for closed fracture: Secondary | ICD-10-CM

## 2014-06-26 NOTE — Progress Notes (Signed)
Chief Complaint  Patient presents with  . Follow-up    4 week follow up + xray right hand fx, DOI 05/26/14    Fracture care follow-up fifth metacarpal fracture x-ray shows slight angulation but as stated in my prior note he has good finger function and good flexion he was concerned about the bump on the back of his hand but I told him as long as his fingers are straight and he can make a good fist and grip that he will be fine  Come back in 4 weeks for repeat x-rays. Buddy tape the fingers for splinting and active range of motion.

## 2014-06-30 IMAGING — CR DG KNEE COMPLETE 4+V*L*
4 series · 4 of 4 positions shown · non-contrast
Comparison: 04/26/2013

CLINICAL DATA: Chronic left knee pain.  No known injury.

EXAM:
LEFT KNEE - COMPLETE 4+ VIEW

[ap]
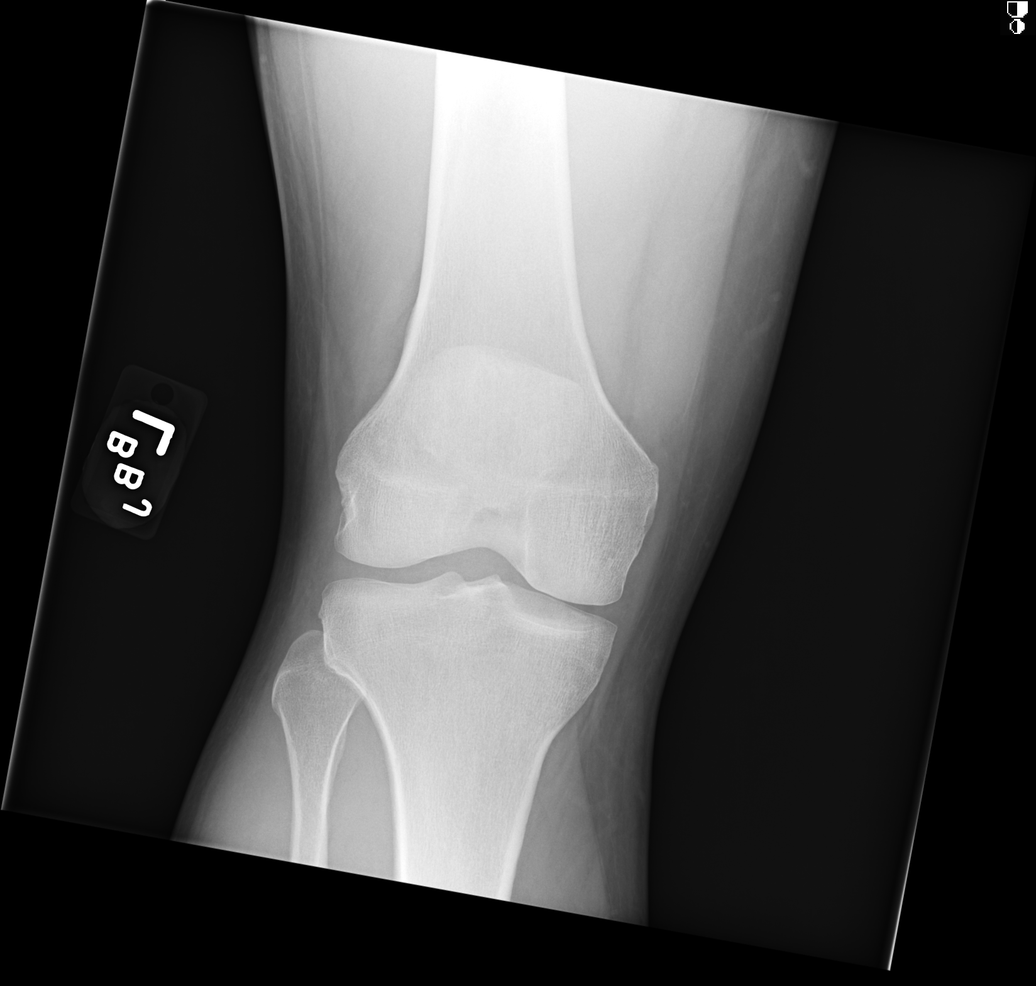

[lat]
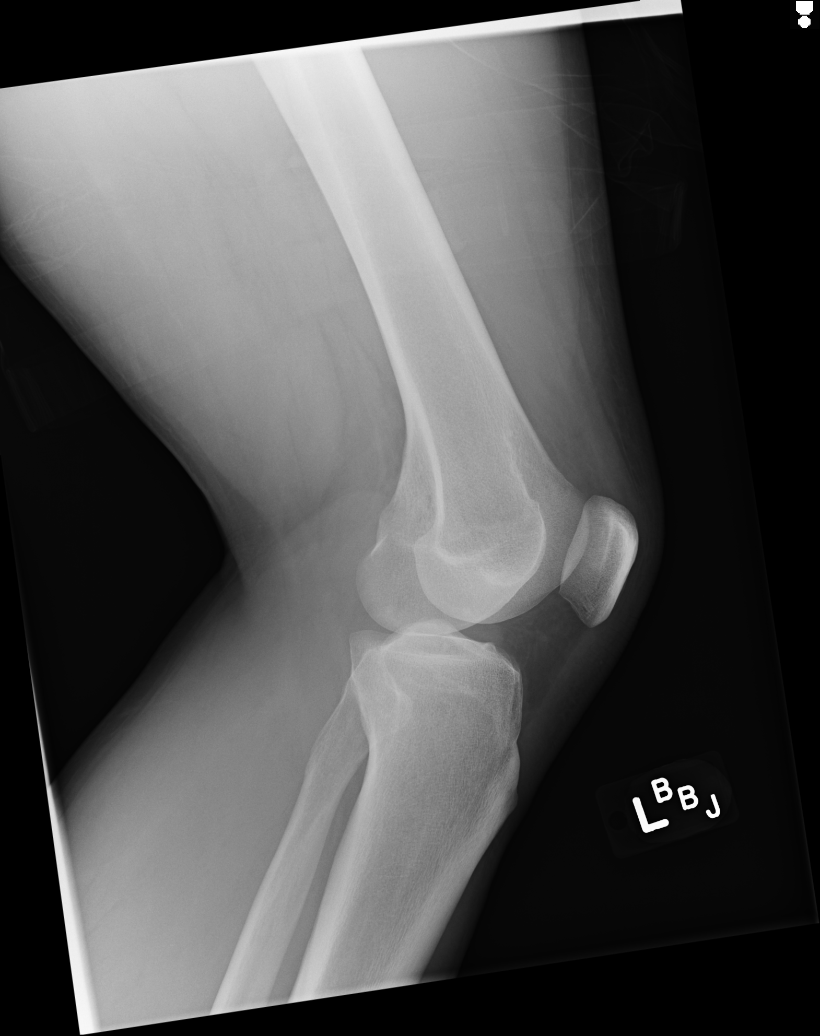

[oblique (1 of 2)]
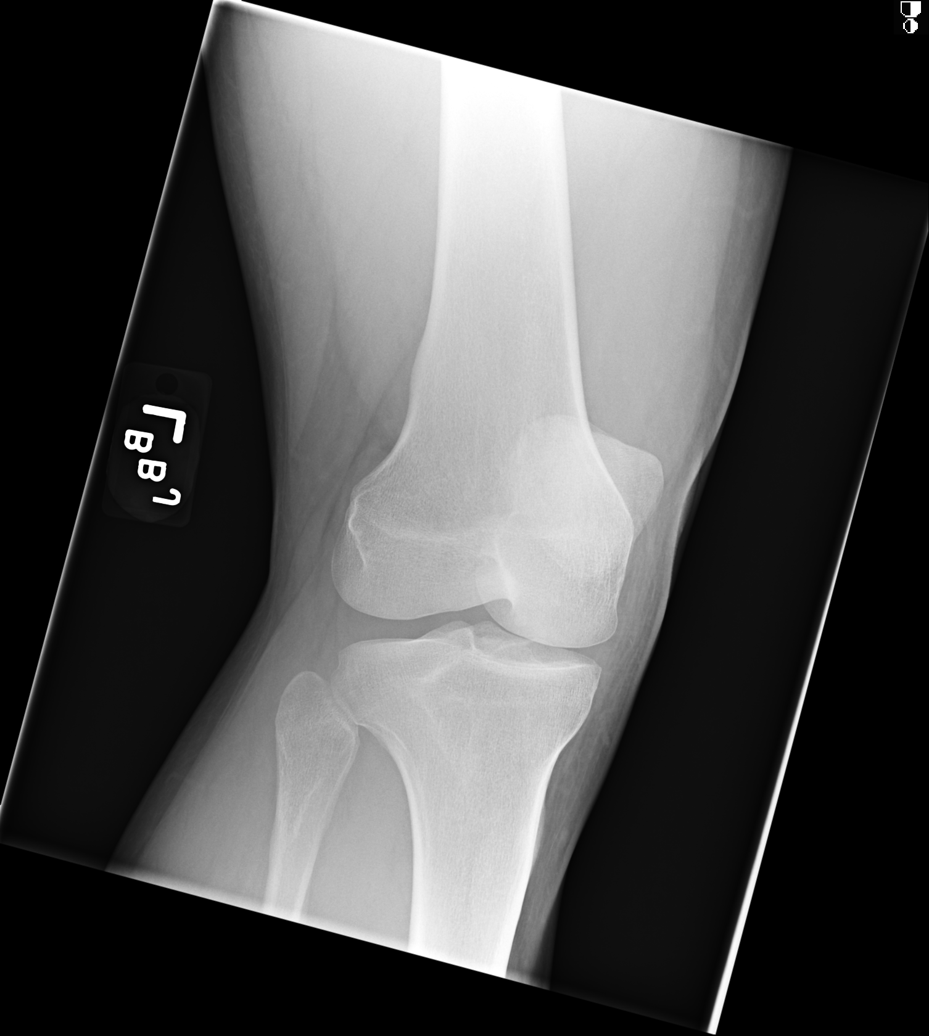

[oblique (2 of 2)]
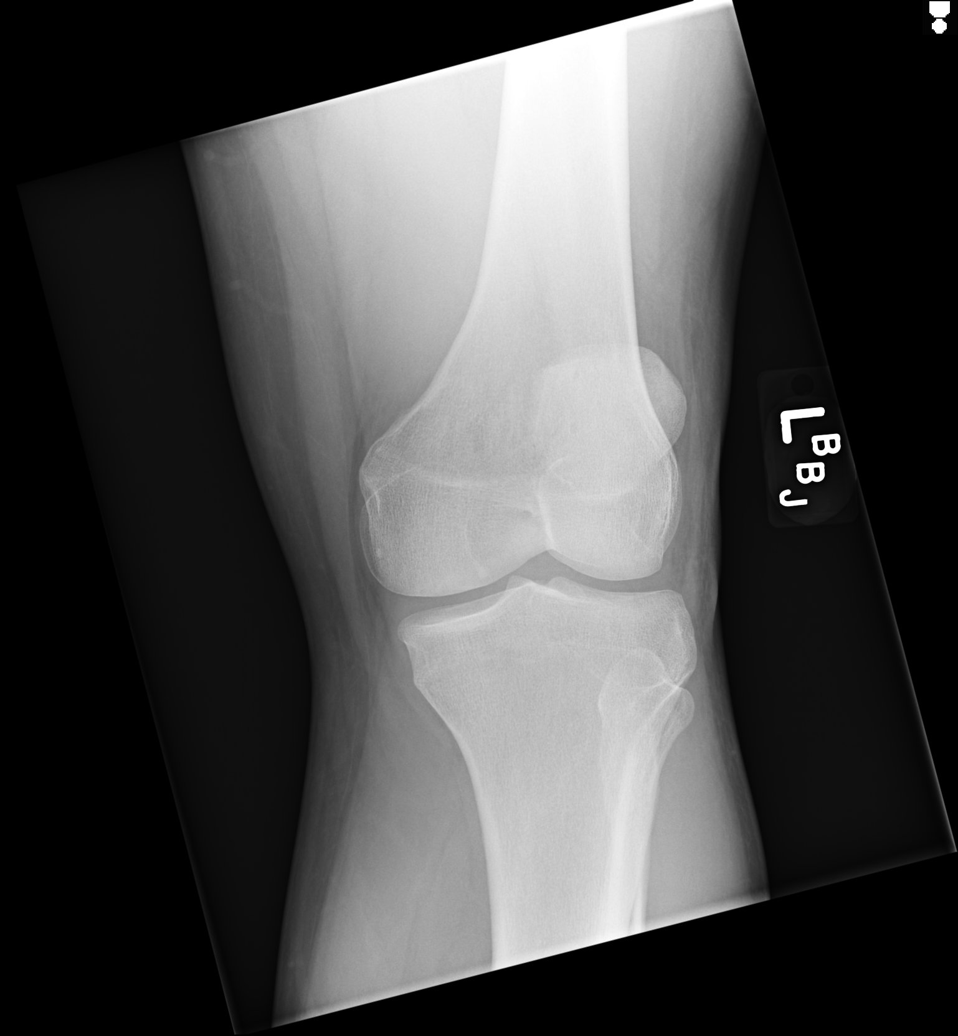

[4 of 4 positions shown; findings below may reference images not displayed]

FINDINGS: There is no evidence of fracture, dislocation, or joint effusion.
There is no evidence of arthropathy or other focal bone abnormality.
Soft tissues are unremarkable.
IMPRESSION: Negative.

## 2014-07-01 ENCOUNTER — Ambulatory Visit (HOSPITAL_COMMUNITY): Payer: Self-pay | Admitting: Psychiatry

## 2014-07-18 ENCOUNTER — Telehealth (HOSPITAL_COMMUNITY): Payer: Self-pay | Admitting: *Deleted

## 2014-07-18 NOTE — Telephone Encounter (Signed)
Pt called stating he would like refills for his Xanax. Per pt he do not have anymore. Pt no showed for last appt that was scheduled for 07-01-14. Per pt f/u appt is scheduled for 07-29-14. Pt number is [phone].

## 2014-07-22 NOTE — Telephone Encounter (Signed)
He was given script on 5/3 with 2 refills

## 2014-07-23 NOTE — Telephone Encounter (Signed)
Per Tammy from pt pharmacy, pt did have 90 tablet left. It was entered in their systm incorrectly. Per Tammy, they did call pt and informed him and he is suppose to be picking up his medication later today.

## 2014-07-23 NOTE — Telephone Encounter (Signed)
lmtcb

## 2014-07-24 ENCOUNTER — Encounter: Payer: Self-pay | Admitting: Orthopedic Surgery

## 2014-07-24 ENCOUNTER — Ambulatory Visit (INDEPENDENT_AMBULATORY_CARE_PROVIDER_SITE_OTHER): Payer: Self-pay | Admitting: Orthopedic Surgery

## 2014-07-24 ENCOUNTER — Ambulatory Visit (INDEPENDENT_AMBULATORY_CARE_PROVIDER_SITE_OTHER): Payer: Medicare Other

## 2014-07-24 VITALS — BP 105/74 | Ht 65.5 in | Wt 180.0 lb

## 2014-07-24 DIAGNOSIS — S62609D Fracture of unspecified phalanx of unspecified finger, subsequent encounter for fracture with routine healing: Secondary | ICD-10-CM

## 2014-07-24 DIAGNOSIS — S62309D Unspecified fracture of unspecified metacarpal bone, subsequent encounter for fracture with routine healing: Secondary | ICD-10-CM

## 2014-07-24 NOTE — Progress Notes (Signed)
Patient ID: Salem CasterRobert J Baskett, male   DOB: Dec 13, 1975, 39 y.o.   MRN: [id number]  Follow up visit  Chief Complaint  Patient presents with  . Follow-up    4 week follow up + xray right hand fracture, DOI 05/26/14    BP 105/74 mmHg  Ht 5' 5.5" (1.664 m)  Wt 180 lb (81.647 kg)  BMI 29.49 kg/m2  Encounter Diagnosis  Name Primary?  . Metacarpal bone fracture, with routine healing, subsequent encounter Yes    8 weeks status post fifth metacarpal fracture  Fracture healing apparent on x-ray minimal angulation  Patient's function at this hand is completely normal with full range of motion no rotatory deformity and no tenderness  Patient is released

## 2014-07-29 ENCOUNTER — Ambulatory Visit (HOSPITAL_COMMUNITY): Payer: Self-pay | Admitting: Psychiatry

## 2014-08-06 ENCOUNTER — Ambulatory Visit (HOSPITAL_COMMUNITY): Payer: Self-pay | Admitting: Psychiatry

## 2014-08-07 ENCOUNTER — Ambulatory Visit (HOSPITAL_COMMUNITY): Payer: Self-pay | Admitting: Psychiatry

## 2014-08-11 ENCOUNTER — Telehealth (HOSPITAL_COMMUNITY): Payer: Self-pay | Admitting: *Deleted

## 2014-08-11 NOTE — Telephone Encounter (Signed)
Per pt he is hearing voices and have not had good sleep as of his last called 08-07-14. Suggested to pt to go to the emergency room to get evaluated. Asked pt if he had someone to take him and he stated yes. Per pt he will go around 5-6 o clock 08-11-14. Pt was suppose to come into office 08-07-14 but no showed for appt. Pt called the same day of appt 08-07-14 and stated he over slept. Informed Dr. Tenny Craw that pt no showed for his appt and the amount of no shows pt had. Per Dr. Tenny Craw, office need to inform pt of dismissal due to the amount of no show and what practice policy is for No Shows. Informed pt of what Dr. Tenny Craw stated and the reason for that and pt agreed and verbalized understanding. Informed pt that office is required to provide 30 days treatment due to policy and pt verbalized understanding. Suggested to pt again to go to have the person that he stated that is with him to take him to the ER to get evaluated and he agreed to do so. Informed pt that message will be sent to Dr. Tenny Craw. Informed pt that provider is out of the office and pt verbalized understanding. Informed pt that office is unable to resch appt due to what provided office to inform pt. Pt verbalized understanding.

## 2014-08-18 ENCOUNTER — Other Ambulatory Visit (HOSPITAL_COMMUNITY): Payer: Self-pay | Admitting: Psychiatry

## 2014-08-21 NOTE — Telephone Encounter (Signed)
noted 

## 2014-08-22 ENCOUNTER — Encounter (HOSPITAL_COMMUNITY): Payer: Self-pay | Admitting: *Deleted

## 2014-08-25 ENCOUNTER — Other Ambulatory Visit (HOSPITAL_COMMUNITY): Payer: Self-pay | Admitting: Psychiatry

## 2014-08-25 ENCOUNTER — Telehealth (HOSPITAL_COMMUNITY): Payer: Self-pay | Admitting: *Deleted

## 2014-08-25 MED ORDER — ESCITALOPRAM OXALATE 20 MG PO TABS: 20.0000 mg | ORAL_TABLET | Freq: Every day | ORAL | Status: DC

## 2014-08-25 MED ORDER — LITHIUM CARBONATE 300 MG PO TABS: 900.0000 mg | ORAL_TABLET | Freq: Two times a day (BID) | ORAL | Status: DC

## 2014-08-25 MED ORDER — ALPRAZOLAM 1 MG PO TABS: 1.0000 mg | ORAL_TABLET | Freq: Three times a day (TID) | ORAL | Status: DC

## 2014-08-25 NOTE — Telephone Encounter (Signed)
Lithium and lexapro sent in. You may call in 30 day supply of xanax

## 2014-08-25 NOTE — Telephone Encounter (Signed)
Called pharmacy and spoke with [facility]

## 2014-08-25 NOTE — Telephone Encounter (Signed)
Pt keeps calling and also leaving messages stating that he is aware that he is dismissed but his 30 days is not up yet and he wants his medication refills on all of his psy. medications. It volume was a bit high. Per pt he knows the policy but he's out and need to have Dr. Tenny Craw fill his medications until the whole month is up. Pt number is [phone].

## 2014-08-25 NOTE — Telephone Encounter (Signed)
Per Dr. Linus Orn to call pt pharmacy and call in 30 days worth of Xanax to pt pharmacy. Called pharmacy and spoke [employer] and she stated she will get script ready

## 2014-08-26 ENCOUNTER — Telehealth (HOSPITAL_COMMUNITY): Payer: Self-pay | Admitting: *Deleted

## 2014-08-26 MED ORDER — LITHIUM CARBONATE 300 MG PO TABS
900.0000 mg | ORAL_TABLET | Freq: Two times a day (BID) | ORAL | Status: AC
Start: 2014-08-26 — End: 2015-08-26

## 2014-08-26 MED ORDER — ESCITALOPRAM OXALATE 20 MG PO TABS: 20.0000 mg | ORAL_TABLET | Freq: Every day | ORAL | Status: DC

## 2014-08-26 MED ORDER — LITHIUM CARBONATE 300 MG PO TABS: 900.0000 mg | ORAL_TABLET | Freq: Two times a day (BID) | ORAL | Status: DC

## 2014-08-26 NOTE — Telephone Encounter (Signed)
Pt called stating that his Lithium and Lexapro went to the wrong pharmacy which it should have went to [facility]. While on the phone with pt, [place] [facility] Mardelle Matte) called stating that pt have not been filling his Lithium and Lexapro there for a while. Per Mardelle Matte, he thought pt was being non compliant with his medication. Per Mardelle Matte, pt informed him that he no longer use them for his Lithium and Lexapro and would call office to inform us that he wants it filled with [facility]. This was verified with pt and pt agreed and stated he did not have any refills at [facility]. Called [facility] and asked them when was the last time pt picked up his Lithium and they stated 05-26-14 with 30 tablets and still have 1 more refills left. Asked Carollee Herter (which is the pharmacist) about pt Lexapro and she stated that pt last picked up that medication on 08-04-14 and she also still have 1 more refills left. Informed Carollee Herter that Dr. Tenny Craw would like to only fill 1 month worth of these to medications and to go ahead and fill medication for pt. Carollee Herter agreed.

## 2014-08-26 NOTE — Telephone Encounter (Signed)
Please route to [facility] to call

## 2014-08-26 NOTE — Telephone Encounter (Signed)
Mardelle Matte, pharmacist called.   please cal regarding patient .

## 2014-08-28 NOTE — Telephone Encounter (Signed)
Things are completed

## 2014-12-16 ENCOUNTER — Emergency Department (HOSPITAL_COMMUNITY)
Admission: EM | Admit: 2014-12-16 | Discharge: 2014-12-16 | Disposition: A | Discharge status: Home / Self Care | Payer: Medicare Other | Attending: Emergency Medicine | Admitting: Emergency Medicine

## 2014-12-16 ENCOUNTER — Encounter (HOSPITAL_COMMUNITY): Payer: Self-pay | Admitting: *Deleted

## 2014-12-16 DIAGNOSIS — Z8781 Personal history of (healed) traumatic fracture: Secondary | ICD-10-CM | POA: Insufficient documentation

## 2014-12-16 DIAGNOSIS — M79672 Pain in left foot: Secondary | ICD-10-CM | POA: Diagnosis not present

## 2014-12-16 DIAGNOSIS — Z79899 Other long term (current) drug therapy: Secondary | ICD-10-CM | POA: Diagnosis not present

## 2014-12-16 DIAGNOSIS — F319 Bipolar disorder, unspecified: Secondary | ICD-10-CM | POA: Insufficient documentation

## 2014-12-16 DIAGNOSIS — Z880 Allergy status to penicillin: Secondary | ICD-10-CM | POA: Diagnosis not present

## 2014-12-16 DIAGNOSIS — G8929 Other chronic pain: Secondary | ICD-10-CM | POA: Diagnosis not present

## 2014-12-16 DIAGNOSIS — L84 Corns and callosities: Secondary | ICD-10-CM

## 2014-12-16 DIAGNOSIS — F17210 Nicotine dependence, cigarettes, uncomplicated: Secondary | ICD-10-CM | POA: Insufficient documentation

## 2014-12-16 DIAGNOSIS — F419 Anxiety disorder, unspecified: Secondary | ICD-10-CM | POA: Insufficient documentation

## 2014-12-16 DIAGNOSIS — M79671 Pain in right foot: Secondary | ICD-10-CM | POA: Diagnosis present

## 2014-12-16 NOTE — ED Notes (Signed)
Pt reporting pain in feet for several weeks. Reports pain disrupts sleep.

## 2014-12-16 NOTE — Discharge Instructions (Signed)
Please see Dr. Charlsie Merlesregal or member of his team concerning her feet. [facility] is taking new patients for primary care. You may benefit from giving them a call. Warm Epsom salt soaks, followed by petroleum jelly massaged of the feet may be helpful. Corns and Calluses Corns are small areas of thickened skin that occur on the top, sides, or tip of a toe. They contain a cone-shaped core with a point that can press on a nerve below. This causes pain. Calluses are areas of thickened skin that can occur anywhere on the body including hands, fingers, palms, soles of the feet, and heels.Calluses are usually larger than corns.  CAUSES  Corns and calluses are caused by rubbing (friction) or pressure, such as from shoes that are too tight or do not fit properly.  RISK FACTORS Corns are more likely to develop in people who have toe deformities, such as hammer toes. Since calluses can occur with friction to any area of the skin, calluses are more likely to develop in people who:   Work with their hands.  Wear shoes that fit poorly, shoes that are too tight, or shoes that are high-heeled.  Have toes deformities. SYMPTOMS Symptoms of a corn or callus include:  A hard growth on the skin.   Pain or tenderness under the skin.   Redness and swelling.   Increased discomfort while wearing tight-fitting shoes. DIAGNOSIS  Corns and calluses may be diagnosed with a medical history and physical exam.  TREATMENT  Corns and calluses may be treated with:  Removing the cause of the friction or pressure. This may include:  Changing your shoes.  Wearing shoe inserts (orthotics) or other protective layers in your shoes, such as a corn pad.  Wearing gloves.  Medicines to help soften skin in the hardened, thickened areas.  Reducing the size of the corn or callus by removing the dead layers of skin.  Antibiotic medicines to treat infection.  Surgery, if a toe deformity is the  cause. HOME CARE INSTRUCTIONS   Take medicines only as directed by your health care provider.  If you were prescribed an antibiotic, finish all of it even if you start to feel better.  Wear shoes that fit well. Avoid wearing high-heeled shoes and shoes that are too tight or too loose.  Wear any padding, protective layers, gloves, or orthotics as directed by your health care provider.  Soak your hands or feet and then use a file or pumice stone to soften your corn or callus. Do this as directed by your health care provider.  Check your corn or callus every day for signs of infection. Watch for:  Redness, swelling, or pain.  Fluid, blood, or pus. SEEK MEDICAL CARE IF:   Your symptoms do not improve with treatment.  You have increased redness, swelling, or pain at the site of your corn or callus.  You have fluid, blood, or pus coming from your corn or callus.  You have new symptoms.   This information is not intended to replace advice given to you by your health care provider. Make sure you discuss any questions you have with your health care provider.   Document Released: 10/10/2003 Document Revised: 05/20/2014 Document Reviewed: 12/30/2013 Elsevier Interactive Patient Education [employer]

## 2014-12-16 NOTE — ED Provider Notes (Signed)
CSN: [id number]     Arrival date & time 12/16/14  1946 History   First MD Initiated Contact with Patient 12/16/14 2043     Chief Complaint  Patient presents with  . Foot Pain     (Consider location/radiation/quality/duration/timing/severity/associated sxs/prior Treatment) Patient is a 39 y.o. male presenting with lower extremity pain. The history is provided by the patient.  Foot Pain This is a chronic problem. The current episode started more than 1 month ago. The problem occurs intermittently. The problem has been gradually worsening. Pertinent negatives include no fever, numbness or weakness. The symptoms are aggravated by standing and walking. Treatments tried: OTC callus medication. The treatment provided no relief.    Past Medical History  Diagnosis Date  . Bipolar 1 disorder (HCC)   . Depression   . Schizophrenia (HCC)   . Anxiety   . Chronic back pain   . Fracture, ankle    Past Surgical History  Procedure Laterality Date  . Vasectomy     Family History  Problem Relation Age of Onset  . Alcohol abuse Father   . Anxiety disorder Mother   . Heart attack Mother    Social History  Substance Use Topics  . Smoking status: Current Every Day Smoker -- 2.00 packs/day for 25 years    Types: Cigarettes  . Smokeless tobacco: Never Used  . Alcohol Use: Yes     Comment: occasionally    Review of Systems  Constitutional: Negative for fever.  Musculoskeletal: Positive for back pain.       Bilateral foot pain  Neurological: Negative for weakness and numbness.  Psychiatric/Behavioral: The patient is nervous/anxious.   All other systems reviewed and are negative.     Allergies  Penicillins  Home Medications   Prior to Admission medications   Medication Sig Start Date End Date Taking? Authorizing Provider  albuterol (PROAIR HFA) 108 (90 BASE) MCG/ACT inhaler Inhale 2 puffs into the lungs 4 (four) times daily as needed for wheezing or shortness of breath.   Yes  Historical Provider, MD  escitalopram (LEXAPRO) 20 MG tablet Take 1 tablet (20 mg total) by mouth daily. 08/26/14  Yes Myrlene Brokereborah R Ross, MD  lithium 300 MG tablet Take 3 tablets (900 mg total) by mouth 2 (two) times daily. 08/26/14 08/26/15 Yes Myrlene Brokereborah R Ross, MD  ALPRAZolam Prudy Feeler(XANAX) 1 MG tablet Take 1 tablet (1 mg total) by mouth 3 (three) times daily. Patient not taking: Reported on 12/16/2014 08/25/14 08/25/15  Myrlene Brokereborah R Ross, MD  HYDROcodone-acetaminophen (NORCO/VICODIN) 5-325 MG per tablet Take 1 tablet by mouth every 4 (four) hours as needed. Patient not taking: Reported on 07/24/2014 05/29/14   Vickki HearingStanley E Harrison, MD   BP 114/75 mmHg  Pulse 85  Temp(Src) 98.3 F (36.8 C) (Oral)  Resp 14  Ht 5' 5.5" (1.664 m)  Wt 72.576 kg  BMI 26.21 kg/m2  SpO2 96% Physical Exam  Constitutional: He is oriented to person, place, and time. He appears well-developed and well-nourished.  Non-toxic appearance.  HENT:  Head: Normocephalic.  Right Ear: Tympanic membrane and external ear normal.  Left Ear: Tympanic membrane and external ear normal.  Eyes: EOM and lids are normal. Pupils are equal, round, and reactive to light.  Neck: Normal range of motion. Neck supple. Carotid bruit is not present.  Cardiovascular: Normal rate, regular rhythm, normal heart sounds, intact distal pulses and normal pulses.   Pulmonary/Chest: Breath sounds normal. No respiratory distress.  Abdominal: Soft. Bowel sounds are normal. There is no  tenderness. There is no guarding.  Musculoskeletal: Normal range of motion.  There are multiple calluses, some thicker than others noted on the plantar surface of the right and left foot. No drainage noted, no red streaks appreciated. No lesions noted between the toes. There no lesions of the plantar surface of the feet. The Achilles tendon is intact. The dorsalis pedis is 2+ bilaterally.  Lymphadenopathy:       Head (right side): No submandibular adenopathy present.       Head (left side): No  submandibular adenopathy present.    He has no cervical adenopathy.  Neurological: He is alert and oriented to person, place, and time. He has normal strength. No cranial nerve deficit or sensory deficit.  No gross neurologic deficits noted of the right or left foot.  Skin: Skin is warm and dry.  Psychiatric: He has a normal mood and affect. His speech is normal.  Nursing note and vitals reviewed.   ED Course  Procedures (including critical care time) Labs Review Labs Reviewed - No data to display  Imaging Review No results found. I have personally reviewed and evaluated these images and lab results as part of my medical decision-making.   EKG Interpretation None      MDM  Vital signs are well within normal limits. The examination favors multiple thick callus formations. Discussed with the patient the need to be seen by podiatry specialist for management of this issue. The patient is referred to Dr. Charlsie Merles or member of his team in [place].    Final diagnoses:  None    **I have reviewed nursing notes, vital signs, and all appropriate lab and imaging results for this patient.Ivery Quale, PA-C 12/16/14 2128  Vanetta Mulders, MD 12/18/14 [phone]

## 2015-01-15 ENCOUNTER — Telehealth: Payer: Self-pay | Admitting: *Deleted

## 2015-01-15 NOTE — Telephone Encounter (Signed)
Debra from OT hand therapist is faxing a script

## 2015-05-14 ENCOUNTER — Emergency Department (HOSPITAL_COMMUNITY)
Admission: EM | Admit: 2015-05-14 | Discharge: 2015-05-15 | Disposition: A | Discharge status: Home / Self Care | Payer: Medicare Other | Attending: Emergency Medicine | Admitting: Emergency Medicine

## 2015-05-14 ENCOUNTER — Encounter (HOSPITAL_COMMUNITY): Payer: Self-pay | Admitting: Emergency Medicine

## 2015-05-14 DIAGNOSIS — W1830XA Fall on same level, unspecified, initial encounter: Secondary | ICD-10-CM | POA: Diagnosis not present

## 2015-05-14 DIAGNOSIS — R21 Rash and other nonspecific skin eruption: Secondary | ICD-10-CM | POA: Diagnosis present

## 2015-05-14 DIAGNOSIS — Y9301 Activity, walking, marching and hiking: Secondary | ICD-10-CM | POA: Diagnosis not present

## 2015-05-14 DIAGNOSIS — Z79899 Other long term (current) drug therapy: Secondary | ICD-10-CM | POA: Diagnosis not present

## 2015-05-14 DIAGNOSIS — Y92129 Unspecified place in nursing home as the place of occurrence of the external cause: Secondary | ICD-10-CM | POA: Insufficient documentation

## 2015-05-14 DIAGNOSIS — F17210 Nicotine dependence, cigarettes, uncomplicated: Secondary | ICD-10-CM | POA: Diagnosis not present

## 2015-05-14 DIAGNOSIS — F319 Bipolar disorder, unspecified: Secondary | ICD-10-CM | POA: Diagnosis not present

## 2015-05-14 DIAGNOSIS — Y999 Unspecified external cause status: Secondary | ICD-10-CM | POA: Insufficient documentation

## 2015-05-14 DIAGNOSIS — L509 Urticaria, unspecified: Secondary | ICD-10-CM | POA: Insufficient documentation

## 2015-05-14 DIAGNOSIS — F209 Schizophrenia, unspecified: Secondary | ICD-10-CM | POA: Insufficient documentation

## 2015-05-14 DIAGNOSIS — S8391XA Sprain of unspecified site of right knee, initial encounter: Secondary | ICD-10-CM | POA: Diagnosis not present

## 2015-05-14 MED ORDER — DIPHENHYDRAMINE HCL 50 MG/ML IJ SOLN
25.0000 mg | Freq: Once | INTRAMUSCULAR | Status: AC
  Administered 2015-05-15: 25 mg via INTRAVENOUS
  Filled 2015-05-14: qty 1

## 2015-05-14 MED ORDER — METHYLPREDNISOLONE SODIUM SUCC 125 MG IJ SOLR
125.0000 mg | Freq: Once | INTRAMUSCULAR | Status: AC
  Administered 2015-05-15: 125 mg via INTRAVENOUS
  Filled 2015-05-14: qty 2

## 2015-05-14 MED ORDER — FAMOTIDINE IN NACL 20-0.9 MG/50ML-% IV SOLN
20.0000 mg | Freq: Once | INTRAVENOUS | Status: AC
  Administered 2015-05-15: 20 mg via INTRAVENOUS
  Filled 2015-05-14: qty 50

## 2015-05-14 NOTE — ED Notes (Signed)
Pt states that he started having rash over legs and arms about 2145

## 2015-05-14 NOTE — ED Notes (Signed)
Patient told not to get out of bed because he was being transported to room 19. Patient got up and threw his self in the floor. Patient is c/o of right knee down to the toes of pain. I helped him get back in the bed with both rails up.

## 2015-05-14 NOTE — ED Provider Notes (Signed)
CSN: [id number]     Arrival date & time 05/14/15  2241 History   First MD Initiated Contact with Patient 05/14/15 2319     Chief Complaint  Patient presents with  . Rash     (Consider location/radiation/quality/duration/timing/severity/associated sxs/prior Treatment) HPI   Craig [name] is a 40 y.o. male who presents to the Emergency Department complaining of gradually worsening rash that began approximately two hours ago.  He states that he noticed itching and a raised rash to his abdomen.  He reports severe itching and states that he developed sharp chest pain shortly after the rash began.  He states that he has hx of anxiety and his PMD took him off xanax and "I got myself worked up after all this itching"  He denies any new exposures or medications although admits to eating grapes earlier this evening that were different type of grapes than he has eaten previously.  He denies difficulty swallowing, shortness of breath, arm pain, diaphoresis, lip or tongue swelling or swelling   Past Medical History  Diagnosis Date  . Bipolar 1 disorder (HCC)   . Depression   . Schizophrenia (HCC)   . Anxiety   . Chronic back pain   . Fracture, ankle    Past Surgical History  Procedure Laterality Date  . Vasectomy     Family History  Problem Relation Age of Onset  . Alcohol abuse Father   . Anxiety disorder Mother   . Heart attack Mother    Social History  Substance Use Topics  . Smoking status: Current Every Day Smoker -- 2.00 packs/day for 25 years    Types: Cigarettes  . Smokeless tobacco: Never Used  . Alcohol Use: Yes     Comment: occasionally    Review of Systems  Constitutional: Negative for fever, chills, activity change and appetite change.  HENT: Negative for facial swelling, sore throat and trouble swallowing.   Respiratory: Negative for cough, chest tightness, shortness of breath and wheezing.   Cardiovascular: Positive for chest pain.  Gastrointestinal: Negative for  nausea and vomiting.  Musculoskeletal: Negative for neck pain and neck stiffness.  Skin: Positive for rash. Negative for wound.  Neurological: Negative for dizziness, weakness, numbness and headaches.  All other systems reviewed and are negative.     Allergies  Penicillins  Home Medications   Prior to Admission medications   Medication Sig Start Date End Date Taking? Authorizing Provider  escitalopram (LEXAPRO) 20 MG tablet Take 1 tablet (20 mg total) by mouth daily. 08/26/14  Yes Myrlene Broker, MD  lithium 300 MG tablet Take 3 tablets (900 mg total) by mouth 2 (two) times daily. 08/26/14 08/26/15 Yes Myrlene Broker, MD  albuterol (PROAIR HFA) 108 (90 BASE) MCG/ACT inhaler Inhale 2 puffs into the lungs 4 (four) times daily as needed for wheezing or shortness of breath.    Historical Provider, MD  ALPRAZolam Prudy Feeler) 1 MG tablet Take 1 tablet (1 mg total) by mouth 3 (three) times daily. Patient not taking: Reported on 12/16/2014 08/25/14 08/25/15  Myrlene Broker, MD  HYDROcodone-acetaminophen (NORCO/VICODIN) 5-325 MG per tablet Take 1 tablet by mouth every 4 (four) hours as needed. Patient not taking: Reported on 07/24/2014 05/29/14   Vickki Hearing, MD   BP 126/84 mmHg  Pulse 77  Temp(Src) 98.1 F (36.7 C) (Oral)  Resp 18  Ht  (1.651 m)  Wt 68.04 kg  BMI 24.96 kg/m2  SpO2 98% Physical Exam  Constitutional: He is oriented  to person, place, and time. He appears well-developed and well-nourished. No distress.  HENT:  Head: Normocephalic and atraumatic.  Mouth/Throat: Uvula is midline, oropharynx is clear and moist and mucous membranes are normal. No oral lesions. No trismus in the jaw. No uvula swelling.  Eyes: Conjunctivae are normal. Pupils are equal, round, and reactive to light.  Neck: Normal range of motion. Neck supple.  Cardiovascular: Normal rate, regular rhythm, normal heart sounds and intact distal pulses.   No murmur heard. Pulmonary/Chest: Effort normal and breath  sounds normal. No respiratory distress.  Musculoskeletal: Normal range of motion. He exhibits no edema or tenderness.  Lymphadenopathy:    He has no cervical adenopathy.  Neurological: He is alert and oriented to person, place, and time. He exhibits normal muscle tone. Coordination normal.  Skin: Skin is warm. Rash noted. There is erythema.  Diffuse welts to the right forearm, chest, abdomen and left leg.    Nursing note and vitals reviewed.   ED Course  Procedures (including critical care time) Labs Review Labs Reviewed - No data to display  Imaging Review Dg Knee Complete 4 Views Right  05/15/2015  CLINICAL DATA:  Patient threw himself on floor, with right knee pain. Initial encounter. EXAM: RIGHT KNEE - COMPLETE 4+ VIEW COMPARISON:  None. FINDINGS: There is no evidence of fracture or dislocation. The joint spaces are preserved. No significant degenerative change is seen; the patellofemoral joint is grossly unremarkable in appearance. No significant joint effusion is seen. The visualized soft tissues are normal in appearance. IMPRESSION: No evidence of fracture or dislocation. Electronically Signed   By: Roanna RaiderJeffery  Chang M.D.   On: 05/15/2015 01:12   I have personally reviewed and evaluated these images and lab results as part of my medical decision-making.   EKG Interpretation None        EKG reviewed and charted by Dr. Bebe ShaggyWickline  MDM   Final diagnoses:  Urticaria  Knee sprain, right, initial encounter    Pt is anxious appearing.  Scratching extensively at his abdomen.  Diffuse hives w/o respiratory distress  It was reported to me by nursing staff that while patient was being transported via stretcher to another room, patient tried to get up and walk and fell.  Landing on his right knee.  He stated that he has previous injury to the knee and his knee with occasionally "give away"  Anterior tenderness on exam w/o effusion or step off deformity.    0110  Patient resting  comfortably.  Hives have greatly improved.  Airway remains patent.  Requesting snack and soda.  Ace wrap applied to the knee for support.  Likely sprain.  Pt appears stable for d/c and agrees to prednisone, benadryl and close PMD f/u.  Referral info given for orthopedics as well.    Pauline Ausammy Levia Waltermire, PA-C 05/15/15 0132  Zadie Rhineonald Wickline, MD 05/15/15 [phone]

## 2015-05-15 ENCOUNTER — Emergency Department (HOSPITAL_COMMUNITY): Payer: Medicare Other

## 2015-05-15 MED ORDER — PREDNISONE 10 MG PO TABS: ORAL_TABLET | ORAL | Status: DC

## 2015-05-15 MED ORDER — DIPHENHYDRAMINE HCL 25 MG PO CAPS: 25.0000 mg | ORAL_CAPSULE | ORAL | Status: DC | PRN

## 2015-05-15 MED ORDER — HYDROCODONE-ACETAMINOPHEN 5-325 MG PO TABS: ORAL_TABLET | ORAL | Status: DC

## 2015-05-15 NOTE — ED Notes (Signed)
Pt instructed on prescriptions, discharged instructions, follow up, and given pre-pack. Pt stated understanding and had no further questions at this time. Pt ambulated to waiting room with steady and even gait.

## 2015-05-15 NOTE — Discharge Instructions (Signed)
Hives °Hives are itchy, red, puffy (swollen) areas of the skin. Hives can change in size and location on your body. Hives can come and go for hours, days, or weeks. Hives do not spread from person to person (noncontagious). Scratching, exercise, and stress can make your hives worse. °HOME CARE °· Avoid things that cause your hives (triggers). °· Take antihistamine medicines as told by your doctor. Do not drive while taking an antihistamine. °· Take any other medicines for itching as told by your doctor. °· Wear loose-fitting clothing. °· Keep all doctor visits as told. °GET HELP RIGHT AWAY IF:  °· You have a fever. °· Your tongue or lips are puffy. °· You have trouble breathing or swallowing. °· You feel tightness in the throat or chest. °· You have belly (abdominal) pain. °· You have lasting or severe itching that is not helped by medicine. °· You have painful or puffy joints. °These problems may be the first sign of a life-threatening allergic reaction. Call your local emergency services (911 in [place] °MAKE SURE YOU:  °· Understand these instructions. °· Will watch your condition. °· Will get help right away if you are not doing well or get worse. °  °This information is not intended to replace advice given to you by your health care provider. Make sure you discuss any questions you have with your health care provider. °  °Document Released: 10/13/2007 Document Revised: 07/05/2011 Document Reviewed: 03/29/2011 °Elsevier Interactive Patient Education ©2016 [employer] ° °

## 2015-05-15 NOTE — ED Provider Notes (Signed)
ED ECG REPORT   Date: 05/14/2015 2359  Rate: 73  Rhythm: normal sinus rhythm  QRS Axis:left axis deviation  Intervals: normal  ST/T Wave abnormalities: normal  Conduction Disutrbances:none  Narrative Interpretation:   Old EKG Reviewed: unchanged  I have personally reviewed the EKG tracing and agree with the computerized printout as noted.   Zadie Rhineonald Draxton Luu, MD 05/15/15 [phone]

## 2015-05-18 ENCOUNTER — Ambulatory Visit (INDEPENDENT_AMBULATORY_CARE_PROVIDER_SITE_OTHER): Payer: Medicare Other | Admitting: Orthopedic Surgery

## 2015-05-18 VITALS — BP 115/74 | HR 63 | Ht 65.0 in | Wt 172.5 lb

## 2015-05-18 DIAGNOSIS — M25461 Effusion, right knee: Secondary | ICD-10-CM

## 2015-05-18 NOTE — Patient Instructions (Signed)

## 2015-05-18 NOTE — Progress Notes (Signed)
Chief Complaint  Patient presents with  . Knee Pain    Right knee pain, twisted it a little over a month ago sprained and then fell 05/14/15   HPI 40 year old male presents for evaluation of his right knee. 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 X-ray was taken it was negative.  He says it is getting better but he does complain of right knee pain swelling some stiffness and some aching with giving out frequently  Review of Systems  Constitutional: Negative for fever and chills.  Musculoskeletal: Positive for back pain.  Neurological: Negative for dizziness, tingling, sensory change and focal weakness.  Endo/Heme/Allergies: Does not bruise/bleed easily.  Psychiatric/Behavioral: Positive for depression. The patient is nervous/anxious.     Past Medical History  Diagnosis Date  . Bipolar 1 disorder (HCC)   . Depression   . Schizophrenia (HCC)   . Anxiety   . Chronic back pain   . Fracture, ankle     Past Surgical History  Procedure Laterality Date  . Vasectomy     Family History  Problem Relation Age of Onset  . Alcohol abuse Father   . Anxiety disorder Mother   . Heart attack Mother    Social History  Substance Use Topics  . Smoking status: Current Every Day Smoker -- 2.00 packs/day for 25 years    Types: Cigarettes  . Smokeless tobacco: Never Used  . Alcohol Use: Yes     Comment: occasionally    Current outpatient prescriptions:  .  escitalopram (LEXAPRO) 20 MG tablet, Take 1 tablet (20 mg total) by mouth daily., Disp: 30 tablet, Rfl: 0 .  HYDROcodone-acetaminophen (NORCO/VICODIN) 5-325 MG tablet, Take one-two tabs po q 4-6 hrs prn pain, Disp: 6 tablet, Rfl: 0 .  lithium 300 MG tablet, Take 3 tablets (900 mg total) by mouth 2 (two) times daily., Disp: 180 tablet, Rfl: 0  BP 115/74 mmHg  Pulse 63  Ht 5\' 5"  (1.651 m)  Wt 172 lb 8 oz (78.245 kg)  BMI 28.71 kg/m2  Physical Exam  Constitutional: He is oriented to person,  place, and time. He appears well-developed and well-nourished. No distress.  Cardiovascular: Normal rate and intact distal pulses.   Neurological: He is alert and oriented to person, place, and time.  Skin: Skin is warm and dry. No rash noted. He is not diaphoretic. No erythema. No pallor.  Psychiatric: He has a normal mood and affect. His behavior is normal. Judgment and thought content normal.    Ortho Exam Ambulation appears to be normal. He has a small effusion in his right knee some of his flexion is limited by the swelling compared to the left no instability no joint line tenderness normal muscle tone skin is intact no rash distal pulses are normal no sensory abnormalities  Left knee normal range of motion stability strength and alignment skin pulses and sensation.  ASSESSMENT: My personal interpretation of the images:  X-ray was done at the hospital it was normal    PLAN Procedure note injection and aspiration right knee joint  Verbal consent was obtained to aspirate and inject the right knee joint   Timeout was completed to confirm the site of aspiration and injection  An 18-gauge needle was used to aspirate the knee joint from a suprapatellar lateral approach.  The medications used were 40 mg of Depo-Medrol and 1% lidocaine 3 cc  Anesthesia was provided by ethyl chloride and the skin was prepped with  alcohol.  After cleaning the skin with alcohol an 18-gauge needle was used to aspirate the right knee joint.  We obtained 15  cc of fluid amber  We follow this by injection of 40 mg of Depo-Medrol and 3 cc 1% lidocaine.  There were no complications. A sterile bandage was applied.

## 2015-05-25 MED FILL — Hydrocodone-Acetaminophen Tab 5-325 MG: ORAL | Qty: 6 | Status: AC

## 2015-06-29 ENCOUNTER — Ambulatory Visit (INDEPENDENT_AMBULATORY_CARE_PROVIDER_SITE_OTHER): Payer: Medicare Other | Admitting: Orthopedic Surgery

## 2015-06-29 ENCOUNTER — Encounter: Payer: Self-pay | Admitting: Orthopedic Surgery

## 2015-06-29 DIAGNOSIS — M25461 Effusion, right knee: Secondary | ICD-10-CM

## 2015-06-29 NOTE — Progress Notes (Signed)
NO SHOW

## 2015-10-05 ENCOUNTER — Encounter (HOSPITAL_COMMUNITY): Payer: Self-pay | Admitting: Emergency Medicine

## 2015-10-05 ENCOUNTER — Emergency Department (HOSPITAL_COMMUNITY)
Admission: EM | Admit: 2015-10-05 | Discharge: 2015-10-05 | Disposition: A | Discharge status: Home / Self Care | Payer: Medicare Other | Attending: Emergency Medicine | Admitting: Emergency Medicine

## 2015-10-05 ENCOUNTER — Emergency Department (HOSPITAL_COMMUNITY): Payer: Medicare Other

## 2015-10-05 DIAGNOSIS — Y929 Unspecified place or not applicable: Secondary | ICD-10-CM | POA: Insufficient documentation

## 2015-10-05 DIAGNOSIS — S6991XA Unspecified injury of right wrist, hand and finger(s), initial encounter: Secondary | ICD-10-CM

## 2015-10-05 DIAGNOSIS — X58XXXA Exposure to other specified factors, initial encounter: Secondary | ICD-10-CM | POA: Insufficient documentation

## 2015-10-05 DIAGNOSIS — F17210 Nicotine dependence, cigarettes, uncomplicated: Secondary | ICD-10-CM | POA: Diagnosis not present

## 2015-10-05 DIAGNOSIS — Y990 Civilian activity done for income or pay: Secondary | ICD-10-CM | POA: Diagnosis not present

## 2015-10-05 DIAGNOSIS — Z79899 Other long term (current) drug therapy: Secondary | ICD-10-CM | POA: Diagnosis not present

## 2015-10-05 DIAGNOSIS — Y9389 Activity, other specified: Secondary | ICD-10-CM | POA: Insufficient documentation

## 2015-10-05 MED ORDER — HYDROCODONE-ACETAMINOPHEN 5-325 MG PO TABS: ORAL_TABLET | ORAL | 0 refills | Status: DC

## 2015-10-05 MED ORDER — NAPROXEN 500 MG PO TABS: 500.0000 mg | ORAL_TABLET | Freq: Two times a day (BID) | ORAL | 0 refills | Status: DC

## 2015-10-05 NOTE — ED Notes (Signed)
Applied bulky bandage to hand and wrist.

## 2015-10-05 NOTE — ED Notes (Addendum)
Pt seen and evaluated by EDPa for initial assessment. 

## 2015-10-05 NOTE — ED Provider Notes (Signed)
[facility]-EMERGENCY DEPT Provider Note   CSN: [id number] Arrival date & time: 10/05/15  1806  By signing my name below, I, Soijett Blue, attest that this documentation has been prepared under the direction and in the presence of Pauline Aus, PA-C Electronically Signed: Soijett Blue, ED Scribe. 10/05/15. 7:09 PM.   History   Chief Complaint Chief Complaint  Patient presents with  . Hand Injury    HPI Craig [name] is a 40 y.o. male who presents to the Emergency Department complaining of right hand injury occurring 2 days ago. Pt reports that he fractured his right hand last year where he was evaluated by an orthopedist and he wore a splint for a "couple hours". Pt states that he was at work 2 days ago and overexerted himself, but he denies any recent injury or trauma to the hand. Pt notes that his right hand pain shoots up to his right upper arm with movement of his hand. Pt denies any alleviating factors. Pt states that he is right hand dominant. He notes that he has not tried ice or medications for the relief of his symptoms. He denies color change, wound, rash, swelling, and any other symptoms.    The history is provided by the patient. No language interpreter was used.  Hand Injury   The incident occurred 2 days ago. The incident occurred at work. There was no injury mechanism. The pain is present in the right hand. The pain is moderate. The pain has been constant since the incident. He reports no foreign bodies present. The symptoms are aggravated by movement. He has tried nothing for the symptoms. The treatment provided no relief.    Past Medical History:  Diagnosis Date  . Anxiety   . Bipolar 1 disorder (HCC)   . Chronic back pain   . Depression   . Fracture, ankle   . Schizophrenia [facility])     Patient Active Problem List   Diagnosis Date Noted  . Anxiety state, unspecified 11/26/2012  . Depression 11/26/2012    Past Surgical History:  Procedure Laterality Date  .  VASECTOMY         Home Medications    Prior to Admission medications   Medication Sig Start Date End Date Taking? Authorizing Provider  escitalopram (LEXAPRO) 20 MG tablet Take 1 tablet (20 mg total) by mouth daily. 08/26/14   Myrlene Broker, MD  HYDROcodone-acetaminophen (NORCO/VICODIN) 5-325 MG tablet Take one-two tabs po q 4-6 hrs prn pain 05/15/15   Pauline Aus, PA-C    Family History Family History  Problem Relation Age of Onset  . Alcohol abuse Father   . Anxiety disorder Mother   . Heart attack Mother     Social History Social History  Substance Use Topics  . Smoking status: Current Every Day Smoker    Packs/day: 2.00    Years: 25.00    Types: Cigarettes  . Smokeless tobacco: Never Used  . Alcohol use Yes     Comment: occasionally     Allergies   Penicillins   Review of Systems Review of Systems  Musculoskeletal: Positive for arthralgias (right hand). Negative for joint swelling.  Skin: Negative for color change, rash and wound.     Physical Exam Updated Vital Signs BP 116/74 (BP Location: Left Arm)   Temp 97.8 F (36.6 C) (Oral)   Resp 18   Ht 5\' 5"  (1.651 m)   Wt 163 lb (73.9 kg)   SpO2 99%   BMI 27.12 kg/m  Physical Exam  Constitutional: He is oriented to person, place, and time. He appears well-developed and well-nourished. No distress.  HENT:  Head: Normocephalic and atraumatic.  Eyes: EOM are normal.  Neck: Neck supple.  Cardiovascular: Normal rate, regular rhythm and normal heart sounds.  Exam reveals no gallop and no friction rub.   No murmur heard. Radial pulses intact  Pulmonary/Chest: Effort normal and breath sounds normal. No respiratory distress. He has no wheezes. He has no rales.  Abdominal: He exhibits no distension.  Musculoskeletal: Normal range of motion.  Tenderness to the ulnar aspect of right hand and wrist. No bony deformity. Sensation intact. Compartments soft  Neurological: He is alert and oriented to person,  place, and time.  Skin: Skin is warm and dry.  Psychiatric: He has a normal mood and affect. His behavior is normal.  Nursing note and vitals reviewed.    ED Treatments / Results  DIAGNOSTIC STUDIES: Oxygen Saturation is 99% on RA, nl by my interpretation.    COORDINATION OF CARE: 7:04 PM Discussed treatment plan with pt at bedside which includes right hand xray and pt agreed to plan.   Radiology Dg Hand Complete Right  Result Date: 10/05/2015 CLINICAL DATA:  Right hand injury with pain.  Initial encounter. EXAM: RIGHT HAND - COMPLETE 3+ VIEW COMPARISON:  07/24/2014 FINDINGS: Completed healing at the fifth metacarpal fracture seen previously. No acute fracture or malalignment. Mild spurring at the second and third MCP joints without narrowing or erosion. IMPRESSION: 1. No acute finding. 2. Remote fifth metacarpal fracture. Electronically Signed   By: Marnee SpringJonathon  Watts M.D.   On: 10/05/2015 19:15    Procedures Procedures (including critical care time)  Medications Ordered in ED Medications - No data to display   Initial Impression / Assessment and Plan / ED Course  I have reviewed the triage vital signs and the nursing notes.  Pertinent imaging results that were available during my care of the patient were reviewed by me and considered in my medical decision making (see chart for details).  Clinical Course   Pt with likely acute on chronic right hand pain.  NV intact, motor intact.  Bulky dressing applied for comfort.  Pt requests referral to hand specialist.    Final Clinical Impressions(s) / ED Diagnoses   Final diagnoses:  Hand injury, right, initial encounter    New Prescriptions New Prescriptions   No medications on file    I personally performed the services described in this documentation, which was scribed in my presence. The recorded information has been reviewed and is accurate.     Rosey Bathammy Elivia Robotham, PA-C 10/08/15 2159    Donnetta HutchingBrian Cook, MD 10/09/15 [phone]

## 2015-10-05 NOTE — Discharge Instructions (Signed)
Elevate and apply ice packs on/off to your hand.  Call one of the orthopedic doctors listed to arrange a follow-up appt in one week if not improving

## 2015-10-05 NOTE — ED Triage Notes (Signed)
Pt states he thinks he "re-broke" his RT hand 2 days ago at work. States pain shoots up his arm. Pt broke his hand last year.

## 2016-03-02 DIAGNOSIS — Z79899 Other long term (current) drug therapy: Secondary | ICD-10-CM | POA: Diagnosis not present

## 2016-03-02 DIAGNOSIS — F419 Anxiety disorder, unspecified: Secondary | ICD-10-CM | POA: Diagnosis not present

## 2016-04-12 DIAGNOSIS — B351 Tinea unguium: Secondary | ICD-10-CM | POA: Diagnosis not present

## 2016-04-12 DIAGNOSIS — G4701 Insomnia due to medical condition: Secondary | ICD-10-CM | POA: Diagnosis not present

## 2016-04-12 DIAGNOSIS — G43019 Migraine without aura, intractable, without status migrainosus: Secondary | ICD-10-CM | POA: Diagnosis not present

## 2016-04-12 DIAGNOSIS — M545 Low back pain: Secondary | ICD-10-CM | POA: Diagnosis not present

## 2016-04-12 DIAGNOSIS — F419 Anxiety disorder, unspecified: Secondary | ICD-10-CM | POA: Diagnosis not present

## 2016-04-12 DIAGNOSIS — E785 Hyperlipidemia, unspecified: Secondary | ICD-10-CM | POA: Diagnosis not present

## 2016-04-12 DIAGNOSIS — F324 Major depressive disorder, single episode, in partial remission: Secondary | ICD-10-CM | POA: Diagnosis not present

## 2016-04-12 DIAGNOSIS — G4089 Other seizures: Secondary | ICD-10-CM | POA: Diagnosis not present

## 2016-04-30 DIAGNOSIS — F419 Anxiety disorder, unspecified: Secondary | ICD-10-CM | POA: Diagnosis not present

## 2016-04-30 DIAGNOSIS — Z79899 Other long term (current) drug therapy: Secondary | ICD-10-CM | POA: Diagnosis not present

## 2016-04-30 DIAGNOSIS — F209 Schizophrenia, unspecified: Secondary | ICD-10-CM | POA: Diagnosis not present

## 2016-04-30 DIAGNOSIS — F17200 Nicotine dependence, unspecified, uncomplicated: Secondary | ICD-10-CM | POA: Diagnosis not present

## 2016-04-30 DIAGNOSIS — M545 Low back pain: Secondary | ICD-10-CM | POA: Diagnosis not present

## 2016-04-30 DIAGNOSIS — F319 Bipolar disorder, unspecified: Secondary | ICD-10-CM | POA: Diagnosis not present

## 2016-04-30 DIAGNOSIS — G8929 Other chronic pain: Secondary | ICD-10-CM | POA: Diagnosis not present

## 2016-05-03 DIAGNOSIS — L84 Corns and callosities: Secondary | ICD-10-CM | POA: Diagnosis not present

## 2016-05-03 DIAGNOSIS — E785 Hyperlipidemia, unspecified: Secondary | ICD-10-CM | POA: Diagnosis not present

## 2016-05-03 DIAGNOSIS — Z79899 Other long term (current) drug therapy: Secondary | ICD-10-CM | POA: Diagnosis not present

## 2016-05-03 DIAGNOSIS — F419 Anxiety disorder, unspecified: Secondary | ICD-10-CM | POA: Diagnosis not present

## 2016-05-12 ENCOUNTER — Ambulatory Visit (INDEPENDENT_AMBULATORY_CARE_PROVIDER_SITE_OTHER): Payer: Medicare Other | Admitting: Orthopaedic Surgery

## 2016-05-12 ENCOUNTER — Encounter (INDEPENDENT_AMBULATORY_CARE_PROVIDER_SITE_OTHER): Payer: Self-pay | Admitting: Orthopaedic Surgery

## 2016-05-12 VITALS — BP 111/72 | HR 80 | Ht 65.0 in | Wt 175.0 lb

## 2016-05-12 DIAGNOSIS — G8929 Other chronic pain: Secondary | ICD-10-CM | POA: Diagnosis not present

## 2016-05-12 DIAGNOSIS — M545 Low back pain, unspecified: Secondary | ICD-10-CM

## 2016-05-12 NOTE — Progress Notes (Signed)
Office Visit Note   Patient: Craig [name]           Date of Birth: 1976-01-05           MRN: [id number] Visit Date: 05/12/2016              Requested by: No referring provider defined for this encounter. PCP: Pcp Not In System   Assessment & Plan: Visit Diagnoses:  1. Chronic bilateral low back pain without sciatica     Plan: We'll place him on a prednisone 10 mg Dosepak #21 tablets prescribed. Take it with food. Office follow-up 4 weeks. If the symptoms persist will proceed with lumbar MRI scan  Follow-Up Instructions: Return in about 4 weeks (around 06/09/2016).   Orders:  No orders of the defined types were placed in this encounter.  No orders of the defined types were placed in this encounter.     Procedures: No procedures performed   Clinical Data: No additional findings.   Subjective: Chief Complaint  Patient presents with  . Lower Back - Pain    HPI patient is a chronic back pain since 2005. Said previous MRI that showed some disc bulge primarily at L4-5. Has congenital short pedicles congenital stenosis. He had episode this in 10 days ago when his back pain was so severe he was unable to walk. She was in bed for several days. Patient denies associated bowel or bladder symptoms. He states when his back pain is severe he's been almost unable sit on the toilet.  Review of Systems 14 point review systems is performed. He has history of anxiety and depression. Positive for bipolar disorder, chronic back pain previous fracture of the ankle. Previous surgeries include vasectomy. Previous hand fracture. Otherwise negative as it pertains to his history of present illness  Objective: Vital Signs: BP 111/72   Pulse 80   Ht  (1.651 m)   Wt 175 lb (79.4 kg)   BMI 29.12 kg/m   Physical Exam  Constitutional: He is oriented to person, place, and time. He appears well-developed and well-nourished.  HENT:  Head: Normocephalic and atraumatic.  Eyes: EOM are  normal. Pupils are equal, round, and reactive to light.  Neck: No tracheal deviation present. No thyromegaly present.  Cardiovascular: Normal rate.   Pulmonary/Chest: Effort normal. He has no wheezes.  Abdominal: Soft. Bowel sounds are normal.  Musculoskeletal:  Patient's low getting from sitting to standing puts his hands on his thighs and walks his hands up the sides to get the correct position. After 30-60 seconds he can be a reactive slow deliberate gait. Negative straight leg raising 90 normal hip range of motion. Knee and ankle jerk 1+ and symmetrical. Anterior tib EHL gastrocsoleus is strong quad atrophy. No rash ovaries post skin no knee effusion.  Neurological: He is alert and oriented to person, place, and time.  Skin: Skin is warm and dry. Capillary refill takes less than 2 seconds.  Psychiatric: He has a normal mood and affect. His behavior is normal. Judgment and thought content normal.    Ortho Exam  Specialty Comments:  No specialty comments available.  Imaging: No results found.   PMFS History: Patient Active Problem List   Diagnosis Date Noted  . Anxiety state, unspecified 11/26/2012  . Depression 11/26/2012   Past Medical History:  Diagnosis Date  . Anxiety   . Bipolar 1 disorder (HCC)   . Chronic back pain   . Depression   . Fracture, ankle   .  Schizophrenia (HCC)     Family History  Problem Relation Age of Onset  . Alcohol abuse Father   . Anxiety disorder Mother   . Heart attack Mother     Past Surgical History:  Procedure Laterality Date  . VASECTOMY     Social History   Occupational History  . Not on file.   Social History Main Topics  . Smoking status: Current Every Day Smoker    Packs/day: 2.00    Years: 25.00    Types: Cigarettes  . Smokeless tobacco: Never Used  . Alcohol use Yes     Comment: occasionally  . Drug use: No  . Sexual activity: No

## 2016-06-09 ENCOUNTER — Ambulatory Visit (INDEPENDENT_AMBULATORY_CARE_PROVIDER_SITE_OTHER): Payer: Medicare Other | Admitting: Orthopaedic Surgery

## 2016-06-09 ENCOUNTER — Encounter (INDEPENDENT_AMBULATORY_CARE_PROVIDER_SITE_OTHER): Payer: Self-pay | Admitting: Orthopaedic Surgery

## 2016-06-09 VITALS — BP 115/77 | HR 76 | Ht 65.0 in | Wt 170.0 lb

## 2016-06-09 DIAGNOSIS — G8929 Other chronic pain: Secondary | ICD-10-CM

## 2016-06-09 DIAGNOSIS — M545 Low back pain: Secondary | ICD-10-CM | POA: Diagnosis not present

## 2016-06-09 NOTE — Progress Notes (Addendum)
Office Visit Note   Patient: Craig [name]           Date of Birth: 1975/09/18           MRN: [id number] Visit Date: 06/09/2016              Requested by: No referring provider defined for this encounter. PCP: System, Pcp Not In   Assessment & Plan: Visit Diagnoses:  1. Chronic bilateral low back pain, with sciatica presence unspecified     Plan: We'll schedule patient for lumbar MRI for evaluation of his L4-5 level to rule out the disc protrusion with lateral recess stenosis. Office follow-up after MRI scan for review.  Follow-Up Instructions: No Follow-up on file.   Orders:  No orders of the defined types were placed in this encounter.  No orders of the defined types were placed in this encounter.     Procedures: No procedures performed   Clinical Data: No additional findings.   Subjective: Chief Complaint  Patient presents with  . Lower Back - Pain, Follow-up    HPI patient continues to complain of significant chronic low back pain difficulty sleeping. He states at times he has numbness in his legs and barely able to walk usually is little bit more symptoms on his left leg and right leg. Pain is at the lumbosacral junction previous MRI 2005 showed some disc bulge with by foraminal narrowing at L4-5. He also had the CT scan 2012 with similar findings and this was a CT of the abdomen evaluating kidney stones. Patient took the prednisone the taper pack and states he got no relief from this. He still has trouble with mobility trouble sleeping, patient is not working now. Patient Has not worked regularly  since 2009.  Review of Systems 14 point review of systems is updated and is unchanged from 05/12/2016 office visit.  Objective: Vital Signs: BP 115/77   Pulse 76   Ht 5\' 5"  (1.651 m)   Wt 170 lb (77.1 kg)   BMI 28.29 kg/m   Physical Exam  Constitutional: He is oriented to person, place, and time. He appears well-developed and well-nourished.  HENT:  Head:  Normocephalic and atraumatic.  Eyes: EOM are normal. Pupils are equal, round, and reactive to light.  Neck: No tracheal deviation present. No thyromegaly present.  Cardiovascular: Normal rate.   Pulmonary/Chest: Effort normal. He has no wheezes.  Abdominal: Soft. Bowel sounds are normal.  Musculoskeletal:  Patient slow getting from sitting to standing. He has tenderness palpation lumbar spine. atrophy.  Neurological: He is alert and oriented to person, place, and time.  Skin: Skin is warm and dry. Capillary refill takes less than 2 seconds.  Psychiatric: He has a normal mood and affect. His behavior is normal. Judgment and thought content normal.    Ortho Exam negative straight leg raising 90. EHL anterior tib and gastrocsoleus is strong. No quad atrophy. Normal capillary refill good pulses dorsalis pedis posterior tibial.  Specialty Comments:  No specialty comments available.  Imaging: No results found.   PMFS History: Patient Active Problem List   Diagnosis Date Noted  . Anxiety state, unspecified 11/26/2012  . Depression 11/26/2012   Past Medical History:  Diagnosis Date  . Anxiety   . Bipolar 1 disorder (HCC)   . Chronic back pain   . Depression   . Fracture, ankle   . Schizophrenia (HCC)     Family History  Problem Relation Age of Onset  . Alcohol abuse Father   .  Anxiety disorder Mother   . Heart attack Mother     Past Surgical History:  Procedure Laterality Date  . VASECTOMY     Social History   Occupational History  . Not on file.   Social History Main Topics  . Smoking status: Current Every Day Smoker    Packs/day: 2.00    Years: 25.00    Types: Cigarettes  . Smokeless tobacco: Never Used  . Alcohol use Yes     Comment: occasionally  . Drug use: No  . Sexual activity: No

## 2016-06-12 DIAGNOSIS — F419 Anxiety disorder, unspecified: Secondary | ICD-10-CM | POA: Diagnosis not present

## 2016-06-12 DIAGNOSIS — S30860A Insect bite (nonvenomous) of lower back and pelvis, initial encounter: Secondary | ICD-10-CM | POA: Diagnosis not present

## 2016-06-12 DIAGNOSIS — Z79899 Other long term (current) drug therapy: Secondary | ICD-10-CM | POA: Diagnosis not present

## 2016-06-12 DIAGNOSIS — W57XXXA Bitten or stung by nonvenomous insect and other nonvenomous arthropods, initial encounter: Secondary | ICD-10-CM | POA: Diagnosis not present

## 2016-06-12 DIAGNOSIS — S70262A Insect bite (nonvenomous), left hip, initial encounter: Secondary | ICD-10-CM | POA: Diagnosis not present

## 2016-06-12 DIAGNOSIS — S30861A Insect bite (nonvenomous) of abdominal wall, initial encounter: Secondary | ICD-10-CM | POA: Diagnosis not present

## 2016-06-12 DIAGNOSIS — S80862A Insect bite (nonvenomous), left lower leg, initial encounter: Secondary | ICD-10-CM | POA: Diagnosis not present

## 2016-06-12 DIAGNOSIS — S80861A Insect bite (nonvenomous), right lower leg, initial encounter: Secondary | ICD-10-CM | POA: Diagnosis not present

## 2016-06-12 DIAGNOSIS — F17200 Nicotine dependence, unspecified, uncomplicated: Secondary | ICD-10-CM | POA: Diagnosis not present

## 2016-06-12 DIAGNOSIS — F319 Bipolar disorder, unspecified: Secondary | ICD-10-CM | POA: Diagnosis not present

## 2016-06-15 DIAGNOSIS — M5126 Other intervertebral disc displacement, lumbar region: Secondary | ICD-10-CM | POA: Diagnosis not present

## 2016-06-15 DIAGNOSIS — M9983 Other biomechanical lesions of lumbar region: Secondary | ICD-10-CM | POA: Diagnosis not present

## 2016-06-15 DIAGNOSIS — M47817 Spondylosis without myelopathy or radiculopathy, lumbosacral region: Secondary | ICD-10-CM | POA: Diagnosis not present

## 2016-06-15 DIAGNOSIS — M48061 Spinal stenosis, lumbar region without neurogenic claudication: Secondary | ICD-10-CM | POA: Diagnosis not present

## 2016-06-15 DIAGNOSIS — M545 Low back pain: Secondary | ICD-10-CM | POA: Diagnosis not present

## 2016-06-16 ENCOUNTER — Encounter (INDEPENDENT_AMBULATORY_CARE_PROVIDER_SITE_OTHER): Payer: Self-pay | Admitting: Orthopaedic Surgery

## 2016-06-16 ENCOUNTER — Ambulatory Visit (INDEPENDENT_AMBULATORY_CARE_PROVIDER_SITE_OTHER): Payer: Medicare Other | Admitting: Orthopaedic Surgery

## 2016-06-16 VITALS — BP 110/71 | HR 91 | Ht 65.0 in | Wt 170.0 lb

## 2016-06-16 DIAGNOSIS — M48061 Spinal stenosis, lumbar region without neurogenic claudication: Secondary | ICD-10-CM

## 2016-06-16 NOTE — Progress Notes (Signed)
Office Visit Note   Patient: Craig CasterRobert J [name]           Date of Birth: 10/23/1975           MRN: [id number] Visit Date: 06/16/2016              Requested by: No referring provider defined for this encounter. PCP: System, Pcp Not In   Assessment & Plan: Visit Diagnoses: Short pedicles with congenital stenosis and disc bulge at L4-5 level. We'll set him up for an epidural injection and see him back after the injection. I reviewed the MRI scan with and given copy report discussed pathophysiology of the condition.  Plan: L4-5 ESI  Follow-Up Instructions: No Follow-up on file.   Orders:  No orders of the defined types were placed in this encounter.  No orders of the defined types were placed in this encounter.     Procedures: No procedures performed   Clinical Data: No additional findings.   Subjective: Chief Complaint  Patient presents with  . Lower Back - Pain, Follow-up    HPI patient returns with ongoing chronic back pain difficulty sleeping numbness in his legs at times he states he has great difficulty walking. Previous MRI 2005 showing disc bulging. No relief with prednisone pack. He is not working regularly since 2009. Patient denies associated bowel or bladder symptoms no fever or chills.  Review of Systems 14 point review of systems updated and unchanged from 06/09/2016 other than as mentioned in history of present illness   Objective: Vital Signs: BP 110/71   Pulse 91   Ht 5\' 5"  (1.651 m)   Wt 170 lb (77.1 kg)   BMI 28.29 kg/m   Physical Exam  Constitutional: He is oriented to person, place, and time. He appears well-developed and well-nourished.  HENT:  Head: Normocephalic and atraumatic.  Eyes: EOM are normal. Pupils are equal, round, and reactive to light.  Neck: No tracheal deviation present. No thyromegaly present.  Cardiovascular: Normal rate.   Pulmonary/Chest: Effort normal. He has no wheezes.  Abdominal: Soft. Bowel sounds are normal.    Neurological: He is alert and oriented to person, place, and time.  Skin: Skin is warm and dry. Capillary refill takes less than 2 seconds.  Psychiatric: He has a normal mood and affect. His behavior is normal. Judgment and thought content normal.    Ortho Exam patient still same towards the slow stride gait. He has palpation over the lumbar spine without the paraspinal atrophy. Good capillary refill distal pulses are intact no pain with hip range of motion. Negative straight leg raising 90. No quad atrophy.  Specialty Comments:  No specialty comments available.  Imaging:MRI scan lumbar 06/15/2016 shows broad-based central disc protrusion L4-5 with mild to moderate canal and bilateral lateral recess stenosis with mild bilateral L4 foraminal narrowing. Mild facet hypertrophy at L5-S1. Underlying diffuse congenital spinal stenosis due to short pedicles.    PMFS History: Patient Active Problem List   Diagnosis Date Noted  . Anxiety state, unspecified 11/26/2012  . Depression 11/26/2012   Past Medical History:  Diagnosis Date  . Anxiety   . Bipolar 1 disorder (HCC)   . Chronic back pain   . Depression   . Fracture, ankle   . Schizophrenia (HCC)     Family History  Problem Relation Age of Onset  . Alcohol abuse Father   . Anxiety disorder Mother   . Heart attack Mother     Past Surgical History:  Procedure  Laterality Date  . VASECTOMY     Social History   Occupational History  . Not on file.   Social History Main Topics  . Smoking status: Current Every Day Smoker    Packs/day: 2.00    Years: 25.00    Types: Cigarettes  . Smokeless tobacco: Never Used  . Alcohol use Yes     Comment: occasionally  . Drug use: No  . Sexual activity: No

## 2016-06-21 DIAGNOSIS — E785 Hyperlipidemia, unspecified: Secondary | ICD-10-CM | POA: Diagnosis not present

## 2016-06-21 DIAGNOSIS — M545 Low back pain: Secondary | ICD-10-CM | POA: Diagnosis not present

## 2016-06-21 DIAGNOSIS — F319 Bipolar disorder, unspecified: Secondary | ICD-10-CM | POA: Diagnosis not present

## 2016-06-21 DIAGNOSIS — G4089 Other seizures: Secondary | ICD-10-CM | POA: Diagnosis not present

## 2016-06-21 DIAGNOSIS — Z79899 Other long term (current) drug therapy: Secondary | ICD-10-CM | POA: Diagnosis not present

## 2016-06-21 DIAGNOSIS — F419 Anxiety disorder, unspecified: Secondary | ICD-10-CM | POA: Diagnosis not present

## 2016-06-29 ENCOUNTER — Encounter (INDEPENDENT_AMBULATORY_CARE_PROVIDER_SITE_OTHER): Payer: Medicare Other | Admitting: Physical Medicine and Rehabilitation

## 2016-10-15 ENCOUNTER — Emergency Department (HOSPITAL_COMMUNITY): Payer: Medicare Other

## 2016-10-15 ENCOUNTER — Emergency Department (HOSPITAL_COMMUNITY)
Admission: EM | Admit: 2016-10-15 | Discharge: 2016-10-15 | Disposition: A | Discharge status: Home / Self Care | Payer: Medicare Other | Attending: Emergency Medicine | Admitting: Emergency Medicine

## 2016-10-15 ENCOUNTER — Encounter (HOSPITAL_COMMUNITY): Payer: Self-pay | Admitting: *Deleted

## 2016-10-15 DIAGNOSIS — J069 Acute upper respiratory infection, unspecified: Secondary | ICD-10-CM

## 2016-10-15 DIAGNOSIS — R05 Cough: Secondary | ICD-10-CM | POA: Diagnosis not present

## 2016-10-15 DIAGNOSIS — F17210 Nicotine dependence, cigarettes, uncomplicated: Secondary | ICD-10-CM | POA: Insufficient documentation

## 2016-10-15 NOTE — ED Provider Notes (Signed)
[facility]ENCY DEPT Provider Note   CSN: [id number] Arrival date & time: 10/15/16  0113  Time seen 03:45 AM   History   Chief Complaint Chief Complaint  Patient presents with  . URI    HPI Craig [name] is a 41 y.o. male.  HPI  Patient states about 3 days ago he started having a cough with some mucus production, he does not know what color the mucus is. He also has had some rhinorrhea but does not know the color of that. He has some mild sore throat. He denies fever, nausea, vomiting, or diarrhea. He states he's having some upper bilateral chest pain when he coughs. He denies feeling short of breath. He is uncertain if he's having wheezing and he states he's has had wheezing in the past. He states he has used inhalers in the past but has not used one with this episode.  PCP [facility]  Past Medical History:  Diagnosis Date  . Anxiety   . Bipolar 1 disorder (HCC)   . Chronic back pain   . Depression   . Fracture, ankle   . Schizophrenia [facility])     Patient Active Problem List   Diagnosis Date Noted  . Anxiety state, unspecified 11/26/2012  . Depression 11/26/2012    Past Surgical History:  Procedure Laterality Date  . VASECTOMY         Home Medications    None per patient  Prior to Admission medications   Not on File    Family History Family History  Problem Relation Age of Onset  . Alcohol abuse Father   . Anxiety disorder Mother   . Heart attack Mother     Social History Social History  Substance Use Topics  . Smoking status: Current Every Day Smoker    Packs/day: 2.00    Years: 25.00    Types: Cigarettes  . Smokeless tobacco: Never Used  . Alcohol use Yes     Comment: occasionally  smokes 1 ppd, states he will quit "when my toes curl up" On disability for back and mental problems.   Allergies   Penicillins   Review of Systems Review of Systems  All other systems reviewed and are negative.    Physical Exam Updated Vital  Signs BP 114/83 (BP Location: Left Arm)   Pulse 82   Temp 98.5 F (36.9 C) (Oral)   Resp (!) 24   SpO2 97%   Vital signs normal    Physical Exam  Constitutional: He is oriented to person, place, and time. He appears well-developed and well-nourished.  Non-toxic appearance. He does not appear ill. No distress.  HENT:  Head: Normocephalic and atraumatic.  Right Ear: External ear normal.  Left Ear: External ear normal.  Nose: Nose normal. No mucosal edema or rhinorrhea.  Mouth/Throat: Oropharynx is clear and moist and mucous membranes are normal. No dental abscesses or uvula swelling.  Eyes: Pupils are equal, round, and reactive to light. Conjunctivae and EOM are normal.  Neck: Normal range of motion and full passive range of motion without pain. Neck supple.  Cardiovascular: Normal rate, regular rhythm and normal heart sounds.  Exam reveals no gallop and no friction rub.   No murmur heard. Pulmonary/Chest: Effort normal and breath sounds normal. No respiratory distress. He has no wheezes. He has no rhonchi. He has no rales. He exhibits no tenderness and no crepitus.  Abdominal: Soft. Normal appearance and bowel sounds are normal. He exhibits no distension. There is no tenderness.  There is no rebound and no guarding.  Musculoskeletal: Normal range of motion. He exhibits no edema or tenderness.  Moves all extremities well.   Neurological: He is alert and oriented to person, place, and time. He has normal strength. No cranial nerve deficit.  Skin: Skin is warm, dry and intact. No rash noted. No erythema. No pallor.  Psychiatric: He has a normal mood and affect. His speech is normal and behavior is normal. His mood appears not anxious.  Nursing note and vitals reviewed.    ED Treatments / Results  Labs (all labs ordered are listed, but only abnormal results are displayed) Labs Reviewed - No data to display  EKG  EKG Interpretation None       Radiology Dg Chest 2  View  Result Date: 10/15/2016 CLINICAL DATA:  Productive cough.  Chills and body aches. EXAM: CHEST  2 VIEW COMPARISON:  04/26/2014 FINDINGS: The cardiomediastinal contours are normal. Mild bronchitic changes. Pulmonary vasculature is normal. No consolidation, pleural effusion, or pneumothorax. No acute osseous abnormalities are seen. IMPRESSION: Mild bronchitic changes likely smoking related lung disease. No evidence of pneumonia. Electronically Signed   By: Rubye Oaks M.D.   On: 10/15/2016 02:20    Procedures Procedures (including critical care time)  Medications Ordered in ED Medications - No data to display   Initial Impression / Assessment and Plan / ED Course  I have reviewed the triage vital signs and the nursing notes.  Pertinent labs & imaging results that were available during my care of the patient were reviewed by me and considered in my medical decision making (see chart for details).    We discussed patient has a viral upper respiratory tract infection. Healing take ibuprofen and acetaminophen over-the-counter for his chest wall pain from coughing, and take over-the-counter cough medications. He was advised to drink plenty of fluids. He can be rechecked if he gets a high fever, struggle to breathe, or if he is not improving in the next 7-10 days.  Final Clinical Impressions(s) / ED Diagnoses   Final diagnoses:  Viral upper respiratory tract infection    New Prescriptions OTC ibuprofen and acetaminophen   Plan discharge  Devoria Albe, MD, Concha Pyo, MD 10/15/16 [phone]

## 2016-10-15 NOTE — Discharge Instructions (Signed)
Drink plenty of fluids. You can take ibuprofen 600 mg + acetaminophen 650 mg every 6 hrs as needed for chest wall pain from coughing or low grade fever. You can take mucinex DM OTC for cough and use cough drops as needed. Recheck if you get a high fever, struggle to breathe, or if you are not improving over the next 7-10 days.

## 2016-10-15 NOTE — ED Notes (Signed)
Pt seen leaving the department by Tiffany NT, states he was leaving

## 2016-10-15 NOTE — ED Triage Notes (Signed)
Pt c/o cough, headache, runny nose and chills with body aches x 2 days

## 2017-03-19 DIAGNOSIS — F209 Schizophrenia, unspecified: Secondary | ICD-10-CM | POA: Diagnosis not present

## 2017-03-19 DIAGNOSIS — G8929 Other chronic pain: Secondary | ICD-10-CM | POA: Diagnosis not present

## 2017-03-19 DIAGNOSIS — F17200 Nicotine dependence, unspecified, uncomplicated: Secondary | ICD-10-CM | POA: Diagnosis not present

## 2017-03-19 DIAGNOSIS — M545 Low back pain: Secondary | ICD-10-CM | POA: Diagnosis not present

## 2017-03-19 DIAGNOSIS — F319 Bipolar disorder, unspecified: Secondary | ICD-10-CM | POA: Diagnosis not present

## 2017-03-24 DIAGNOSIS — R531 Weakness: Secondary | ICD-10-CM | POA: Diagnosis not present

## 2017-03-24 DIAGNOSIS — M545 Low back pain: Secondary | ICD-10-CM | POA: Diagnosis not present

## 2017-05-22 DIAGNOSIS — F209 Schizophrenia, unspecified: Secondary | ICD-10-CM | POA: Diagnosis not present

## 2017-05-22 DIAGNOSIS — F319 Bipolar disorder, unspecified: Secondary | ICD-10-CM | POA: Diagnosis not present

## 2017-05-22 DIAGNOSIS — F17200 Nicotine dependence, unspecified, uncomplicated: Secondary | ICD-10-CM | POA: Diagnosis not present

## 2017-05-22 DIAGNOSIS — M7051 Other bursitis of knee, right knee: Secondary | ICD-10-CM | POA: Diagnosis not present

## 2017-05-22 DIAGNOSIS — R0602 Shortness of breath: Secondary | ICD-10-CM | POA: Diagnosis not present

## 2017-05-22 DIAGNOSIS — K209 Esophagitis, unspecified: Secondary | ICD-10-CM | POA: Diagnosis not present

## 2017-05-22 DIAGNOSIS — M25561 Pain in right knee: Secondary | ICD-10-CM | POA: Diagnosis not present

## 2017-05-22 DIAGNOSIS — R079 Chest pain, unspecified: Secondary | ICD-10-CM | POA: Diagnosis not present

## 2017-05-22 DIAGNOSIS — K210 Gastro-esophageal reflux disease with esophagitis: Secondary | ICD-10-CM | POA: Diagnosis not present

## 2017-05-22 DIAGNOSIS — R0789 Other chest pain: Secondary | ICD-10-CM | POA: Diagnosis not present

## 2017-08-21 ENCOUNTER — Emergency Department (HOSPITAL_COMMUNITY)
Admission: EM | Admit: 2017-08-21 | Discharge: 2017-08-22 | Disposition: A | Discharge status: Home / Self Care | Payer: Medicare Other | Attending: Emergency Medicine | Admitting: Emergency Medicine

## 2017-08-21 ENCOUNTER — Encounter (HOSPITAL_COMMUNITY): Payer: Self-pay | Admitting: Emergency Medicine

## 2017-08-21 ENCOUNTER — Other Ambulatory Visit: Payer: Self-pay

## 2017-08-21 DIAGNOSIS — R1032 Left lower quadrant pain: Secondary | ICD-10-CM | POA: Insufficient documentation

## 2017-08-21 DIAGNOSIS — F17210 Nicotine dependence, cigarettes, uncomplicated: Secondary | ICD-10-CM | POA: Diagnosis not present

## 2017-08-21 NOTE — ED Notes (Signed)
Pt is aware urine sample is needed. 

## 2017-08-21 NOTE — ED Triage Notes (Signed)
Pt c/o left lower abd pain since yesterday. He denies any n/v/d.

## 2017-08-22 ENCOUNTER — Emergency Department (HOSPITAL_COMMUNITY): Payer: Medicare Other

## 2017-08-22 DIAGNOSIS — R1032 Left lower quadrant pain: Secondary | ICD-10-CM | POA: Diagnosis not present

## 2017-08-22 LAB — CBC
HCT: 50.7 % (ref 39.0–52.0)
Hemoglobin: 17.1 g/dL — ABNORMAL HIGH (ref 13.0–17.0)
MCH: 31.3 pg (ref 26.0–34.0)
MCHC: 33.7 g/dL (ref 30.0–36.0)
MCV: 92.9 fL (ref 78.0–100.0)
PLATELETS: 224 10*3/uL (ref 150–400)
RBC: 5.46 MIL/uL (ref 4.22–5.81)
RDW: 14.2 % (ref 11.5–15.5)
WBC: 10.2 10*3/uL (ref 4.0–10.5)

## 2017-08-22 LAB — URINALYSIS, ROUTINE W REFLEX MICROSCOPIC
Bacteria, UA: NONE SEEN
Bilirubin Urine: NEGATIVE
Glucose, UA: NEGATIVE mg/dL
HGB URINE DIPSTICK: NEGATIVE
Ketones, ur: NEGATIVE mg/dL
Nitrite: NEGATIVE
PH: 5 (ref 5.0–8.0)
Protein, ur: NEGATIVE mg/dL
SPECIFIC GRAVITY, URINE: 1.028 (ref 1.005–1.030)
WBC, UA: >50 WBC/hpf — ABNORMAL HIGH (ref 0–5)

## 2017-08-22 LAB — COMPREHENSIVE METABOLIC PANEL
ALK PHOS: 97 U/L (ref 38–126)
ALT: 21 U/L (ref 0–44)
AST: 17 U/L (ref 15–41)
Albumin: 3.8 g/dL (ref 3.5–5.0)
Anion gap: 9 (ref 5–15)
BILIRUBIN TOTAL: 0.6 mg/dL (ref 0.3–1.2)
BUN: 11 mg/dL (ref 6–20)
CALCIUM: 8.9 mg/dL (ref 8.9–10.3)
CO2: 22 mmol/L (ref 22–32)
CREATININE: 0.73 mg/dL (ref 0.61–1.24)
Chloride: 102 mmol/L (ref 98–111)
GFR calc Af Amer: >60 mL/min (ref >60)
GFR calc non Af Amer: >60 mL/min (ref >60)
GLUCOSE: 99 mg/dL (ref 70–99)
Potassium: 3.9 mmol/L (ref 3.5–5.1)
Sodium: 133 mmol/L — ABNORMAL LOW (ref 135–145)
TOTAL PROTEIN: 7.2 g/dL (ref 6.5–8.1)

## 2017-08-22 LAB — LIPASE, BLOOD: Lipase: 34 U/L (ref 11–51)

## 2017-08-22 MED ORDER — IBUPROFEN 400 MG PO TABS
400.0000 mg | ORAL_TABLET | Freq: Once | ORAL | Status: AC
  Administered 2017-08-22: 400 mg via ORAL
  Filled 2017-08-22: qty 1

## 2017-08-22 MED ORDER — HYDROCODONE-ACETAMINOPHEN 5-325 MG PO TABS: 1.0000 | ORAL_TABLET | ORAL | 0 refills | Status: DC | PRN

## 2017-08-22 NOTE — ED Provider Notes (Signed)
[facility] EMERGENCY DEPARTMENT Provider Note   CSN: [id number] Arrival date & time: 08/21/17  2224     History   Chief Complaint Chief Complaint  Patient presents with  . Abdominal Pain    HPI Craig [name] is a 42 y.o. male.  The history is provided by the patient.  He has a history of schizophrenia, bipolar disorder and comes in complaining of left lower quadrant pain which started yesterday morning and has been constant.  He rates pain at 8/10.  There is no radiation of pain.  There is no nausea or vomiting.  He denies constipation or diarrhea.  He denies urinary urgency, frequency, tenesmus, dysuria.  Nothing makes the pain better, nothing makes it worse.  He has not taken anything for it at home.  He was unable to sleep last night because of the pain.  However, he has been able to eat and drink normally today.  Past Medical History:  Diagnosis Date  . Anxiety   . Bipolar 1 disorder (HCC)   . Chronic back pain   . Depression   . Fracture, ankle   . Schizophrenia [facility])     Patient Active Problem List   Diagnosis Date Noted  . Anxiety state, unspecified 11/26/2012  . Depression 11/26/2012    Past Surgical History:  Procedure Laterality Date  . VASECTOMY          Home Medications    Prior to Admission medications   Not on File    Family History Family History  Problem Relation Age of Onset  . Alcohol abuse Father   . Anxiety disorder Mother   . Heart attack Mother     Social History Social History   Tobacco Use  . Smoking status: Current Every Day Smoker    Packs/day: 2.00    Years: 25.00    Pack years: 50.00    Types: Cigarettes  . Smokeless tobacco: Never Used  Substance Use Topics  . Alcohol use: Yes    Comment: occasionally  . Drug use: No     Allergies   Penicillins   Review of Systems Review of Systems  All other systems reviewed and are negative.    Physical Exam Updated Vital Signs BP 122/86   Pulse 68   Temp 98.1  F (36.7 C)   Resp 18   Ht 5\' 5"  (1.651 m)   Wt 81.6 kg (180 lb)   SpO2 99%   BMI 29.95 kg/m   Physical Exam  Nursing note and vitals reviewed.  42 year old male, resting comfortably and in no acute distress. Vital signs are normal. Oxygen saturation is 99%, which is normal. Head is normocephalic and atraumatic. PERRLA, EOMI. Oropharynx is clear. Neck is nontender and supple without adenopathy or JVD. Back is nontender and there is no CVA tenderness. Lungs are clear without rales, wheezes, or rhonchi. Chest is nontender. Heart has regular rate and rhythm without murmur. Abdomen is soft, flat, with moderate left lower quadrant tenderness.  There is no rebound or guarding.  There are no masses or hepatosplenomegaly and peristalsis is normoactive. Extremities have no cyanosis or edema, full range of motion is present. Skin is warm and dry without rash. Neurologic: Mental status is normal, cranial nerves are intact, there are no motor or sensory deficits.  ED Treatments / Results  Labs (all labs ordered are listed, but only abnormal results are displayed) Labs Reviewed  COMPREHENSIVE METABOLIC PANEL - Abnormal; Notable for the following components:  Result Value   Sodium 133 (*)    All other components within normal limits  CBC - Abnormal; Notable for the following components:   Hemoglobin 17.1 (*)    All other components within normal limits  LIPASE, BLOOD  URINALYSIS, ROUTINE W REFLEX MICROSCOPIC   Radiology Ct Renal Stone Study  Result Date: 08/22/2017 CLINICAL DATA:  Acute onset of left lower quadrant abdominal pain. EXAM: CT ABDOMEN AND PELVIS WITHOUT CONTRAST TECHNIQUE: Multidetector CT imaging of the abdomen and pelvis was performed following the standard protocol without IV contrast. COMPARISON:  MRI of the lumbar spine performed 06/15/2016, and CT of the abdomen and pelvis performed 05/14/2008 FINDINGS: Lower chest: Mild right basilar atelectasis is noted. The  visualized portions of the mediastinum are unremarkable. Hepatobiliary: Mild fatty infiltration is noted within the liver. The liver is otherwise unremarkable. The gallbladder is unremarkable in appearance. The common bile duct is normal in caliber. Pancreas: The pancreas is within normal limits. Spleen: The spleen is unremarkable in appearance. Adrenals/Urinary Tract: The adrenal glands are unremarkable in appearance. The kidneys are within normal limits. There is no evidence of hydronephrosis. No renal or ureteral stones are identified. No perinephric stranding is seen. Stomach/Bowel: The stomach is unremarkable in appearance. The small bowel is within normal limits. The appendix is normal in caliber, without evidence of appendicitis. The colon is unremarkable in appearance. Vascular/Lymphatic: The abdominal aorta is unremarkable in appearance. Minimal calcification is noted at the aortic bifurcation. The inferior vena cava is grossly unremarkable. No retroperitoneal lymphadenopathy is seen. No pelvic sidewall lymphadenopathy is identified. Reproductive: The bladder is mildly distended and grossly unremarkable. The prostate is borderline normal in size, with scattered calcification. Other: No additional soft tissue abnormalities are seen. Musculoskeletal: No acute osseous abnormalities are identified. The visualized musculature is unremarkable in appearance. IMPRESSION: 1. No acute abnormality seen within the abdomen or pelvis. 2. Mild fatty infiltration within the liver. 3. Mild right basilar atelectasis noted. Electronically Signed   By: Roanna RaiderJeffery  Chang M.D.   On: 08/22/2017 01:50    Procedures Procedures   Medications Ordered in ED Medications  ibuprofen (ADVIL,MOTRIN) tablet 400 mg (400 mg Oral Given 08/22/17 0114)     Initial Impression / Assessment and Plan / ED Course  I have reviewed the triage vital signs and the nursing notes.  Pertinent labs & imaging results that were available during my  care of the patient were reviewed by me and considered in my medical decision making (see chart for details).  Left lower quadrant pain.  Major diagnostic possibilities include diverticulitis, renal colic, abdominal aortic aneurysm.  Screening labs are unremarkable.  Urinalysis is still pending at this time.  Old records are reviewed, and he had been seen in 2010 for a right-sided kidney stone.  At that time, CT showed no other renal calculi.  At this point, I feel renal colic is the most likely cause of his pain.  He will be sent for renal stone protocol CT scan.  He will also be given a dose of oral ibuprofen.  CT scan shows no acute process, no evidence of urolithiasis or nephrolithiasis.  He did not get any relief with ibuprofen.  Abdominal exam was repeated, and he continues to have fairly well localized pain in the left lower quadrant.  Because of the pain is unclear at this point.  He is discharged with a take-home pack of hydrocodone-acetaminophen and advised to return if pain is getting worse, or if it is not improved in  24 hours.  Final Clinical Impressions(s) / ED Diagnoses   Final diagnoses:  LLQ abdominal pain    ED Discharge Orders        Ordered    HYDROcodone-acetaminophen (NORCO) 5-325 MG tablet  Every 4 hours PRN     08/22/17 0326       Dione Booze, MD 08/22/17 [id number]

## 2017-08-22 NOTE — ED Notes (Signed)
Patient transported to CT 

## 2017-08-22 NOTE — ED Notes (Signed)
Patient was given a prepackage of Hydrocodone/Aceteminophinen quantity six and given instructions on use, patient verbally understands.

## 2017-08-22 NOTE — Discharge Instructions (Addendum)
Return if pain is getting worse at any time, or if it is not improved after 24 hours

## 2017-08-23 ENCOUNTER — Other Ambulatory Visit: Payer: Self-pay

## 2017-08-23 ENCOUNTER — Emergency Department (HOSPITAL_COMMUNITY)
Admission: EM | Admit: 2017-08-23 | Discharge: 2017-08-23 | Disposition: A | Discharge status: Home / Self Care | Payer: Medicare Other | Attending: Emergency Medicine | Admitting: Emergency Medicine

## 2017-08-23 ENCOUNTER — Encounter (HOSPITAL_COMMUNITY): Payer: Self-pay | Admitting: Emergency Medicine

## 2017-08-23 DIAGNOSIS — R1084 Generalized abdominal pain: Secondary | ICD-10-CM | POA: Diagnosis present

## 2017-08-23 DIAGNOSIS — R1032 Left lower quadrant pain: Secondary | ICD-10-CM | POA: Diagnosis not present

## 2017-08-23 DIAGNOSIS — F17210 Nicotine dependence, cigarettes, uncomplicated: Secondary | ICD-10-CM | POA: Diagnosis not present

## 2017-08-23 LAB — BASIC METABOLIC PANEL
Anion gap: 8 (ref 5–15)
BUN: 13 mg/dL (ref 6–20)
CHLORIDE: 104 mmol/L (ref 98–111)
CO2: 22 mmol/L (ref 22–32)
CREATININE: 0.88 mg/dL (ref 0.61–1.24)
Calcium: 9.4 mg/dL (ref 8.9–10.3)
GFR calc Af Amer: >60 mL/min (ref >60)
GFR calc non Af Amer: >60 mL/min (ref >60)
GLUCOSE: 135 mg/dL — AB (ref 70–99)
Potassium: 3.7 mmol/L (ref 3.5–5.1)
Sodium: 134 mmol/L — ABNORMAL LOW (ref 135–145)

## 2017-08-23 LAB — CBC WITH DIFFERENTIAL/PLATELET
Basophils Absolute: 0.1 10*3/uL (ref 0.0–0.1)
Basophils Relative: 1 %
Eosinophils Absolute: 0.2 10*3/uL (ref 0.0–0.7)
Eosinophils Relative: 2 %
HCT: 50.5 % (ref 39.0–52.0)
HEMOGLOBIN: 17.3 g/dL — AB (ref 13.0–17.0)
Lymphocytes Relative: 24 %
Lymphs Abs: 2.5 10*3/uL (ref 0.7–4.0)
MCH: 31.6 pg (ref 26.0–34.0)
MCHC: 34.3 g/dL (ref 30.0–36.0)
MCV: 92.2 fL (ref 78.0–100.0)
MONO ABS: 0.7 10*3/uL (ref 0.1–1.0)
MONOS PCT: 7 %
NEUTROS ABS: 6.9 10*3/uL (ref 1.7–7.7)
NEUTROS PCT: 66 %
Platelets: 241 10*3/uL (ref 150–400)
RBC: 5.48 MIL/uL (ref 4.22–5.81)
RDW: 13.9 % (ref 11.5–15.5)
WBC: 10.3 10*3/uL (ref 4.0–10.5)

## 2017-08-23 MED ORDER — KETOROLAC TROMETHAMINE 30 MG/ML IJ SOLN
30.0000 mg | Freq: Once | INTRAMUSCULAR | Status: AC
  Administered 2017-08-23: 30 mg via INTRAMUSCULAR
  Filled 2017-08-23: qty 1

## 2017-08-23 MED ORDER — PREDNISONE 20 MG PO TABS: 40.0000 mg | ORAL_TABLET | Freq: Every day | ORAL | 0 refills | Status: DC

## 2017-08-23 MED ORDER — METHOCARBAMOL 500 MG PO TABS: 500.0000 mg | ORAL_TABLET | Freq: Three times a day (TID) | ORAL | 0 refills | Status: DC | PRN

## 2017-08-23 MED FILL — Hydrocodone-Acetaminophen Tab 5-325 MG: ORAL | Qty: 6 | Status: AC

## 2017-08-23 NOTE — ED Provider Notes (Signed)
[facility] EMERGENCY DEPARTMENT Provider Note   CSN: [id number] Arrival date & time: 08/23/17  1337     History   Chief Complaint Chief Complaint  Patient presents with  . Flank Pain    HPI Craig [name] is a 42 y.o. male.  HPI Patient presents with left-sided abdominal pain.  Has had for the last few days.  Seen in the ER 2 days ago without clear diagnosis.  CT scan done at that time without cause seen.  Some white cells in the urine without bacteria.  Continued pain.  No relief with the hydrocodone he was given.  Pain is worse with movement.  It is sharp.  Sometimes will radiate to the testicle.  No dysuria.  Has history of back pain but states this does not feel like his back pain.  No fevers or chills.  No penile discharge.  No constipation.  No trauma. Past Medical History:  Diagnosis Date  . Anxiety   . Bipolar 1 disorder (HCC)   . Chronic back pain   . Depression   . Fracture, ankle   . Schizophrenia [facility])     Patient Active Problem List   Diagnosis Date Noted  . Anxiety state, unspecified 11/26/2012  . Depression 11/26/2012    Past Surgical History:  Procedure Laterality Date  . VASECTOMY          Home Medications    Prior to Admission medications   Medication Sig Start Date End Date Taking? Authorizing Provider  HYDROcodone-acetaminophen (NORCO) 5-325 MG tablet Take 1 tablet by mouth every 4 (four) hours as needed for moderate pain. 08/22/17  Yes Dione Booze, MD  methocarbamol (ROBAXIN) 500 MG tablet Take 1 tablet (500 mg total) by mouth every 8 (eight) hours as needed for muscle spasms. 08/23/17   Benjiman Core, MD  predniSONE (DELTASONE) 20 MG tablet Take 2 tablets (40 mg total) by mouth daily. 08/23/17   Benjiman Core, MD    Family History Family History  Problem Relation Age of Onset  . Alcohol abuse Father   . Anxiety disorder Mother   . Heart attack Mother     Social History Social History   Tobacco Use  . Smoking status: Current  Every Day Smoker    Packs/day: 2.00    Years: 25.00    Pack years: 50.00    Types: Cigarettes  . Smokeless tobacco: Never Used  Substance Use Topics  . Alcohol use: Yes    Comment: occasionally  . Drug use: No     Allergies   Penicillins   Review of Systems Review of Systems  Constitutional: Negative for appetite change.  HENT: Negative for congestion.   Respiratory: Negative for shortness of breath.   Cardiovascular: Negative for chest pain.  Gastrointestinal: Positive for abdominal pain.  Genitourinary: Negative for flank pain.  Musculoskeletal: Positive for back pain.  Neurological: Negative for weakness.  Hematological: Negative for adenopathy.  Psychiatric/Behavioral: Negative for confusion.     Physical Exam Updated Vital Signs BP 103/79 (BP Location: Left Arm)   Pulse 74   Temp 98 F (36.7 C) (Oral)   Resp 16   SpO2 96%   Physical Exam  Constitutional: He appears well-developed.  HENT:  Head: Atraumatic.  Eyes: Pupils are equal, round, and reactive to light.  Neck: Neck supple.  Cardiovascular: Normal rate.  Pulmonary/Chest: He exhibits no tenderness.  Abdominal: There is tenderness.  Tenderness over left lower abdomen.  No mass.  No skin changes.  No rebound  or guarding.  Genitourinary: Penis normal.  Genitourinary Comments: No testicle tenderness.  No hernia palpated.  Musculoskeletal: He exhibits no tenderness.  No lumbar tenderness.  No pain with straight leg raise.  Pain is worsened with use of abdominal musculature.  Neurological: He is alert.  Skin: Skin is warm.     ED Treatments / Results  Labs (all labs ordered are listed, but only abnormal results are displayed) Labs Reviewed  CBC WITH DIFFERENTIAL/PLATELET - Abnormal; Notable for the following components:      Result Value   Hemoglobin 17.3 (*)    All other components within normal limits  BASIC METABOLIC PANEL - Abnormal; Notable for the following components:   Sodium 134 (*)      Glucose, Bld 135 (*)    All other components within normal limits    EKG None  Radiology Ct Renal Stone Study  Result Date: 08/22/2017 CLINICAL DATA:  Acute onset of left lower quadrant abdominal pain. EXAM: CT ABDOMEN AND PELVIS WITHOUT CONTRAST TECHNIQUE: Multidetector CT imaging of the abdomen and pelvis was performed following the standard protocol without IV contrast. COMPARISON:  MRI of the lumbar spine performed 06/15/2016, and CT of the abdomen and pelvis performed 05/14/2008 FINDINGS: Lower chest: Mild right basilar atelectasis is noted. The visualized portions of the mediastinum are unremarkable. Hepatobiliary: Mild fatty infiltration is noted within the liver. The liver is otherwise unremarkable. The gallbladder is unremarkable in appearance. The common bile duct is normal in caliber. Pancreas: The pancreas is within normal limits. Spleen: The spleen is unremarkable in appearance. Adrenals/Urinary Tract: The adrenal glands are unremarkable in appearance. The kidneys are within normal limits. There is no evidence of hydronephrosis. No renal or ureteral stones are identified. No perinephric stranding is seen. Stomach/Bowel: The stomach is unremarkable in appearance. The small bowel is within normal limits. The appendix is normal in caliber, without evidence of appendicitis. The colon is unremarkable in appearance. Vascular/Lymphatic: The abdominal aorta is unremarkable in appearance. Minimal calcification is noted at the aortic bifurcation. The inferior vena cava is grossly unremarkable. No retroperitoneal lymphadenopathy is seen. No pelvic sidewall lymphadenopathy is identified. Reproductive: The bladder is mildly distended and grossly unremarkable. The prostate is borderline normal in size, with scattered calcification. Other: No additional soft tissue abnormalities are seen. Musculoskeletal: No acute osseous abnormalities are identified. The visualized musculature is unremarkable in  appearance. IMPRESSION: 1. No acute abnormality seen within the abdomen or pelvis. 2. Mild fatty infiltration within the liver. 3. Mild right basilar atelectasis noted. Electronically Signed   By: Roanna RaiderJeffery  Chang M.D.   On: 08/22/2017 01:50    Procedures Procedures (including critical care time)  Medications Ordered in ED Medications  ketorolac (TORADOL) 30 MG/ML injection 30 mg (30 mg Intramuscular Given 08/23/17 1446)     Initial Impression / Assessment and Plan / ED Course  I have reviewed the triage vital signs and the nursing notes.  Pertinent labs & imaging results that were available during my care of the patient were reviewed by me and considered in my medical decision making (see chart for details).    Patient abdominal pain.  Recently seen for same.  CT scan reviewed and reassuring.  Reviewed labs and CT scan from prior.  No systemic white count.  Did have some white cells in the urine but is not having dysuria.  Tenderness over abdominal musculature.  Will treat with muscle relaxer and steroids.  Follow-up with PCP as needed.  I am reassured having Artie had  a CAT scan done 2 days ago.   Final Clinical Impressions(s) / ED Diagnoses   Final diagnoses:  Left lower quadrant pain    ED Discharge Orders        Ordered    methocarbamol (ROBAXIN) 500 MG tablet  Every 8 hours PRN     08/23/17 1515    predniSONE (DELTASONE) 20 MG tablet  Daily     08/23/17 1515       Benjiman Core, MD 08/23/17 1531

## 2017-08-23 NOTE — Discharge Instructions (Addendum)
Follow-up with a primary care doctor if symptoms do not improve.  Return for fevers.

## 2017-08-23 NOTE — ED Triage Notes (Signed)
Pt states was seen two days ago diagnosed with kidney stone. Pt reports pain meds not working.

## 2017-10-15 ENCOUNTER — Emergency Department (HOSPITAL_COMMUNITY): Payer: Medicare Other

## 2017-10-15 ENCOUNTER — Emergency Department (HOSPITAL_COMMUNITY)
Admission: EM | Admit: 2017-10-15 | Discharge: 2017-10-15 | Disposition: A | Discharge status: Home / Self Care | Payer: Medicare Other | Attending: Emergency Medicine | Admitting: Emergency Medicine

## 2017-10-15 ENCOUNTER — Encounter (HOSPITAL_COMMUNITY): Payer: Self-pay | Admitting: Emergency Medicine

## 2017-10-15 ENCOUNTER — Other Ambulatory Visit: Payer: Self-pay

## 2017-10-15 DIAGNOSIS — W172XXA Fall into hole, initial encounter: Secondary | ICD-10-CM | POA: Diagnosis not present

## 2017-10-15 DIAGNOSIS — M25561 Pain in right knee: Secondary | ICD-10-CM

## 2017-10-15 DIAGNOSIS — Y990 Civilian activity done for income or pay: Secondary | ICD-10-CM | POA: Diagnosis not present

## 2017-10-15 DIAGNOSIS — F17210 Nicotine dependence, cigarettes, uncomplicated: Secondary | ICD-10-CM | POA: Insufficient documentation

## 2017-10-15 DIAGNOSIS — S8991XA Unspecified injury of right lower leg, initial encounter: Secondary | ICD-10-CM | POA: Insufficient documentation

## 2017-10-15 DIAGNOSIS — Y9389 Activity, other specified: Secondary | ICD-10-CM | POA: Insufficient documentation

## 2017-10-15 DIAGNOSIS — Y929 Unspecified place or not applicable: Secondary | ICD-10-CM | POA: Insufficient documentation

## 2017-10-15 DIAGNOSIS — Z79899 Other long term (current) drug therapy: Secondary | ICD-10-CM | POA: Diagnosis not present

## 2017-10-15 NOTE — ED Provider Notes (Signed)
[facility] EMERGENCY DEPARTMENT Provider Note   CSN: [id number] Arrival date & time: 10/15/17  1206     History   Chief Complaint Chief Complaint  Patient presents with  . Knee Pain    HPI BENETT SWOYER is a 42 y.o. male.  HPI   Patient injured his knee yesterday when he was carrying some steel, and fell into a hole.  He felt a twisting sensation when this occurred.  He had previously injured the right knee 2 years ago, followed up with orthopedics but no intervention was done.  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  He denies other injuries including head, neck and back.  There are no other known modifying factors.  Past Medical History:  Diagnosis Date  . Anxiety   . Bipolar 1 disorder (HCC)   . Chronic back pain   . Depression   . Fracture, ankle   . Schizophrenia [facility])     Patient Active Problem List   Diagnosis Date Noted  . Anxiety state, unspecified 11/26/2012  . Depression 11/26/2012    Past Surgical History:  Procedure Laterality Date  . VASECTOMY          Home Medications    Prior to Admission medications   Medication Sig Start Date End Date Taking? Authorizing Provider  HYDROcodone-acetaminophen (NORCO) 5-325 MG tablet Take 1 tablet by mouth every 4 (four) hours as needed for moderate pain. 08/22/17   Dione Booze, MD  methocarbamol (ROBAXIN) 500 MG tablet Take 1 tablet (500 mg total) by mouth every 8 (eight) hours as needed for muscle spasms. 08/23/17   Benjiman Core, MD  predniSONE (DELTASONE) 20 MG tablet Take 2 tablets (40 mg total) by mouth daily. 08/23/17   Benjiman Core, MD    Family History Family History  Problem Relation Age of Onset  . Alcohol abuse Father   . Anxiety disorder Mother   . Heart attack Mother     Social History Social History   Tobacco Use  . Smoking status: Current Every Day Smoker    Packs/day: 2.00    Years: 25.00    Pack years: 50.00    Types: Cigarettes  . Smokeless  tobacco: Never Used  Substance Use Topics  . Alcohol use: Yes    Comment: occasionally  . Drug use: No     Allergies   Penicillins   Review of Systems Review of Systems  All other systems reviewed and are negative.    Physical Exam Updated Vital Signs BP 126/65 (BP Location: Right Arm)   Pulse 83   Temp 98.1 F (36.7 C) (Oral)   Resp 18   Ht 5\' 5"  (1.651 m)   Wt 81.6 kg   SpO2 95%   BMI 29.95 kg/m   Physical Exam  Constitutional: He is oriented to person, place, and time. He appears well-developed and well-nourished. He appears distressed (He is uncomfortable).  HENT:  Head: Normocephalic and atraumatic.  Right Ear: External ear normal.  Left Ear: External ear normal.  Eyes: Pupils are equal, round, and reactive to light. Conjunctivae and EOM are normal.  Neck: Normal range of motion and phonation normal. Neck supple.  Cardiovascular: Normal rate.  Pulmonary/Chest: Effort normal. He exhibits no bony tenderness.  Musculoskeletal:  Somewhat limited range of motion right knee.  Intact right knee extension.  Moderate right knee effusion.  No gross right knee instability.  Neurovascular intact distally in the right lower leg.  Neurological:  He is alert and oriented to person, place, and time. No cranial nerve deficit or sensory deficit. He exhibits normal muscle tone. Coordination normal.  Skin: Skin is warm, dry and intact.  Psychiatric: He has a normal mood and affect. His behavior is normal. Judgment and thought content normal.  Nursing note and vitals reviewed.    ED Treatments / Results  Labs (all labs ordered are listed, but only abnormal results are displayed) Labs Reviewed - No data to display  EKG None  Radiology No results found.  Procedures Procedures (including critical care time)  Medications Ordered in ED Medications - No data to display   Initial Impression / Assessment and Plan / ED Course  I have reviewed the triage vital signs and the  nursing notes.  Pertinent labs & imaging results that were available during my care of the patient were reviewed by me and considered in my medical decision making (see chart for details).      Patient Vitals for the past 24 hrs:  BP Temp Temp src Pulse Resp SpO2 Height Weight  10/15/17 1219 - - - - - - 5\' 5"  (1.651 m) 81.6 kg  10/15/17 1218 126/65 98.1 F (36.7 C) Oral 83 18 95 % - -    1:16 PM Reevaluation with update and discussion. After initial assessment and treatment, an updated evaluation reveals no change in clinical status.  Findings discussed with the patient all questions answered. Mancel Bale   Medical Decision Making: Recurrent right knee injury, suspect meniscus tear.  Doubt significant right knee internal derangement.  Symptomatic treatment will be provided with expectant management by orthopedics.  CRITICAL CARE- No Performed by: Mancel Bale  Nursing Notes Reviewed/ Care Coordinated Applicable Imaging Reviewed Interpretation of Laboratory Data incorporated into ED treatment  The patient appears reasonably screened and/or stabilized for discharge and I doubt any other medical condition or other [facility] requiring further screening, evaluation, or treatment in the ED at this time prior to discharge.  Plan: Home Medications-ibuprofen for pain; Home Treatments-knee immobilizer whenever up, cryotherapy; return here if the recommended treatment, does not improve the symptoms; Recommended follow up-.  Follow-up as soon as possible hopefully within 3 days.  Light duty for 1 week.   Final Clinical Impressions(s) / ED Diagnoses   Final diagnoses:  None    ED Discharge Orders    None       Mancel Bale, MD 10/15/17 1317

## 2017-10-15 NOTE — Discharge Instructions (Signed)
Wear the knee brace whenever you are up to help the discomfort.  Try to elevate your right leg above your heart is much as possible.  Also use ice on the sore area 3 times a day for 45 minutes.  For pain, take ibuprofen 400 mg 3 times a day.  The orthopedic doctor for a follow-up appointment to be seen and evaluated.  He may choose to order an MRI to evaluate you for a internal injury of the right knee.

## 2017-10-15 NOTE — ED Triage Notes (Signed)
Patient c/o right knee pain after falling at work yesterday. Per patient knee twisted during fall. Denies hitting head or LOC. Per patient hx of knee injuries on right side x4. Per patient swelling. Unable to bear weight.

## 2017-10-18 ENCOUNTER — Ambulatory Visit (INDEPENDENT_AMBULATORY_CARE_PROVIDER_SITE_OTHER): Payer: Medicare Other

## 2017-10-18 ENCOUNTER — Encounter (INDEPENDENT_AMBULATORY_CARE_PROVIDER_SITE_OTHER): Payer: Self-pay | Admitting: Orthopedic Surgery

## 2017-10-18 ENCOUNTER — Ambulatory Visit (INDEPENDENT_AMBULATORY_CARE_PROVIDER_SITE_OTHER): Payer: Medicare Other | Admitting: Orthopedic Surgery

## 2017-10-18 DIAGNOSIS — M25571 Pain in right ankle and joints of right foot: Secondary | ICD-10-CM | POA: Diagnosis not present

## 2017-10-18 DIAGNOSIS — S83511A Sprain of anterior cruciate ligament of right knee, initial encounter: Secondary | ICD-10-CM

## 2017-10-18 DIAGNOSIS — M25561 Pain in right knee: Secondary | ICD-10-CM | POA: Diagnosis not present

## 2017-10-18 NOTE — Progress Notes (Signed)
Office Visit Note   Patient: Craig [name]           Date of Birth: July 14, 1975           MRN: [id number] Visit Date: 10/18/2017 Requested by: No referring provider defined for this encounter. PCP: Patient, No Pcp Per  Subjective: Chief Complaint  Patient presents with  . Right Knee - Pain    HPI: Craig [name] is a 42 year old patient with right knee pain.  Date of injury 10/13/2017.  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  He reports continued pain and instability in the knee.  He had previous injury to the same knee about 2 years ago by his report.  He also describes remote injury to the right ankle.  Is having only mild right ankle symptoms currently.              ROS: All systems reviewed are negative as they relate to the chief complaint within the history of present illness.  Patient denies  fevers or chills.   Assessment & Plan: Visit Diagnoses:  1. Pain in right ankle and joints of right foot   2. Right knee pain, unspecified chronicity   3. Rupture of anterior cruciate ligament of right knee, initial encounter     Plan: Impression is right knee ACL laxity and high likelihood of ACL tear plus meniscal pathology.  Patient has old healed medial malleolar fracture and no evidence of syndesmotic instability on exam or on x-rays.  Plan is right knee MRI scan to evaluate status of menisci in the face of ACL pathology.  Collaterals appear intact and there is no posterior lateral rotatory instability noted.  Follow-Up Instructions: Return for after MRI.   Orders:  Orders Placed This Encounter  Procedures  . XR Ankle Complete Right  . MR Knee Right w/o contrast   No orders of the defined types were placed in this encounter.     Procedures: No procedures performed   Clinical Data: No additional findings.  Objective: Vital Signs: There were no vitals taken for this visit.  Physical Exam:   Constitutional: Patient appears  well-developed HEENT:  Head: Normocephalic Eyes:EOM are normal Neck: Normal range of motion Cardiovascular: Normal rate Pulmonary/chest: Effort normal Neurologic: Patient is alert Skin: Skin is warm Psychiatric: Patient has normal mood and affect    Ortho Exam: Ortho exam demonstrates full active and passive range of motion of that right knee.  Effusion is present.  ACL laxity is present.  Collateral and cruciate ligaments feel stable to varus valgus stress at 0 and 30 degrees.  Pedal pulses palpable in the foot and the syndesmosis feels symmetric with stress testing right versus left.  Extensor mechanism intact on the right.  Specialty Comments:  No specialty comments available.  Imaging: Xr Ankle Complete Right  Result Date: 10/18/2017 AP lateral mortise right ankle reviewed.  Old medial malleolar fracture has healed and slightly malunited position.  Ankle mortise appears symmetric.  No visualized fibular pathology present.    PMFS History: Patient Active Problem List   Diagnosis Date Noted  . Anxiety state, unspecified 11/26/2012  . Depression 11/26/2012   Past Medical History:  Diagnosis Date  . Anxiety   . Bipolar 1 disorder (HCC)   . Chronic back pain   . Depression   . Fracture, ankle   . Schizophrenia (HCC)     Family History  Problem Relation Age of Onset  . Alcohol abuse Father   .  Anxiety disorder Mother   . Heart attack Mother     Past Surgical History:  Procedure Laterality Date  . VASECTOMY     Social History   Occupational History  . Not on file  Tobacco Use  . Smoking status: Current Every Day Smoker    Packs/day: 2.00    Years: 25.00    Pack years: 50.00    Types: Cigarettes  . Smokeless tobacco: Never Used  Substance and Sexual Activity  . Alcohol use: Yes    Comment: occasionally  . Drug use: No  . Sexual activity: Never

## 2017-10-20 ENCOUNTER — Telehealth (INDEPENDENT_AMBULATORY_CARE_PROVIDER_SITE_OTHER): Payer: Self-pay | Admitting: Orthopedic Surgery

## 2017-10-20 ENCOUNTER — Other Ambulatory Visit (INDEPENDENT_AMBULATORY_CARE_PROVIDER_SITE_OTHER): Payer: Self-pay | Admitting: Family Medicine

## 2017-10-20 DIAGNOSIS — Z23 Encounter for immunization: Secondary | ICD-10-CM | POA: Diagnosis not present

## 2017-10-20 MED ORDER — TRAMADOL HCL 50 MG PO TABS: 50.0000 mg | ORAL_TABLET | Freq: Four times a day (QID) | ORAL | 0 refills | Status: DC | PRN

## 2017-10-20 NOTE — Telephone Encounter (Signed)
Tramadol Rx sent 

## 2017-10-20 NOTE — Telephone Encounter (Signed)
Patient called requesting that an RX be called in for him for pain.  He states that he is in a lot of pain and unable to sleep at night because of it. It will be another week before he sees Dr. August Saucer.

## 2017-10-20 NOTE — Telephone Encounter (Signed)
Could you please advise on this one?

## 2017-10-24 ENCOUNTER — Ambulatory Visit
Admission: RE | Admit: 2017-10-24 | Discharge: 2017-10-24 | Disposition: A | Discharge status: Home / Self Care | Payer: Medicare Other | Source: Ambulatory Visit | Attending: Orthopedic Surgery | Admitting: Orthopedic Surgery

## 2017-10-24 DIAGNOSIS — M25561 Pain in right knee: Secondary | ICD-10-CM | POA: Diagnosis not present

## 2017-10-25 ENCOUNTER — Telehealth (INDEPENDENT_AMBULATORY_CARE_PROVIDER_SITE_OTHER): Payer: Self-pay | Admitting: Family Medicine

## 2017-10-25 NOTE — Telephone Encounter (Signed)
IC s/w patient and he will keep appt for Friday.

## 2017-10-25 NOTE — Telephone Encounter (Signed)
I spoke with the patient and advised him Tramadol was sent in to [facility] on 10/20/17 by Dr. Prince Rome.  He is asking if it is possible to be worked in tomorrow with Dr. August Saucer for the MRI review on his knee instead of waiting until his already scheduled appointment on Friday, October 11.  I told him I would pass this message on to Dr. Diamantina Providence assistant and she would be back in touch regarding the appointment.

## 2017-10-25 NOTE — Telephone Encounter (Signed)
Patient states called for something for pain. Has not heard back from anyone.  Please have Dr Prince Rome check the electronic script to see if it went through.  Please call patient to advise. [phone]

## 2017-10-27 ENCOUNTER — Ambulatory Visit (INDEPENDENT_AMBULATORY_CARE_PROVIDER_SITE_OTHER): Payer: Medicare Other | Admitting: Orthopedic Surgery

## 2017-10-27 ENCOUNTER — Telehealth (INDEPENDENT_AMBULATORY_CARE_PROVIDER_SITE_OTHER): Payer: Self-pay | Admitting: Orthopedic Surgery

## 2017-10-27 ENCOUNTER — Encounter (INDEPENDENT_AMBULATORY_CARE_PROVIDER_SITE_OTHER): Payer: Self-pay | Admitting: Orthopedic Surgery

## 2017-10-27 DIAGNOSIS — S83511A Sprain of anterior cruciate ligament of right knee, initial encounter: Secondary | ICD-10-CM

## 2017-10-27 NOTE — Telephone Encounter (Signed)
I told this this ACL reconstruction out of work at least 3 months

## 2017-10-27 NOTE — Telephone Encounter (Signed)
Patient was seen in the office today will need a note specifying his surgery type. Patient is still in the office waiting for a ride if we can provide today.

## 2017-10-27 NOTE — Telephone Encounter (Signed)
Please advise on surgery type and estimated out of work time.

## 2017-10-27 NOTE — Progress Notes (Signed)
Office Visit Note   Patient: Craig [name]           Date of Birth: April 22, 1975           MRN: [id number] Visit Date: 10/27/2017 Requested by: No referring provider defined for this encounter. PCP: Patient, No Pcp Per  Subjective: Chief Complaint  Patient presents with  . Right Knee - Follow-up    HPI: Craig [name] is a patient with right knee pain and instability.  Since I seen him he had an MRI scan.  MRI scan shows ACL tear along with medial meniscal tear predominantly at the meniscal root.  He works at the [employer] currently.  Father had some arterial disease but no discrete family history of deep vein thrombosis or pulmonary embolism.  He does report symptomatic instability in the right knee.              ROS: All systems reviewed are negative as they relate to the chief complaint within the history of present illness.  Patient denies  fevers or chills.   Assessment & Plan: Visit Diagnoses:  1. Rupture of anterior cruciate ligament of right knee, initial encounter     Plan: Impression is ACL tear and medial meniscal root tear with horizontal cleavage component.  Both of these things would combined to make his knee unstable.  Plan is ACL reconstruction with hamstring autograft.  Discussed with him about using allograft but the one in 1 million chance of disease transmission made him not want to pursue that option which would be a quicker recovery for him.  I do not think that meniscus is repairable.  I did tell him that he would develop arthritis in the knee reasonably quickly within 10 years if that meniscus is completely involved.  Patient understands that the surgery is primarily designed to stabilize the knee.  All questions answered.  Follow-Up Instructions: No follow-ups on file.   Orders:  No orders of the defined types were placed in this encounter.  No orders of the defined types were placed in this encounter.     Procedures: No procedures performed   Clinical  Data: No additional findings.  Objective: Vital Signs: There were no vitals taken for this visit.  Physical Exam:   Constitutional: Patient appears well-developed HEENT:  Head: Normocephalic Eyes:EOM are normal Neck: Normal range of motion Cardiovascular: Normal rate Pulmonary/chest: Effort normal Neurologic: Patient is alert Skin: Skin is warm Psychiatric: Patient has normal mood and affect    Ortho Exam: Ortho exam demonstrates ACL laxity with full extension of the right leg.  Pedal pulses palpable.  Collaterals are stable to varus and valgus stress at 0 and 30 degrees.  No other masses lymph adenopathy or skin changes noted in that right knee region.  Extensor mechanism is intact.  There is medial joint line tenderness but no posterior lateral rotatory instability.  Extensive nature of rehab discussed with the patient including no return to work for at least 3 months after surgery.  Other risks such as stiffness incomplete pain relief also discussed.  Specialty Comments:  No specialty comments available.  Imaging: No results found.   PMFS History: Patient Active Problem List   Diagnosis Date Noted  . Anxiety state, unspecified 11/26/2012  . Depression 11/26/2012   Past Medical History:  Diagnosis Date  . Anxiety   . Bipolar 1 disorder (HCC)   . Chronic back pain   . Depression   . Fracture, ankle   . Schizophrenia (HCC)  Family History  Problem Relation Age of Onset  . Alcohol abuse Father   . Anxiety disorder Mother   . Heart attack Mother     Past Surgical History:  Procedure Laterality Date  . VASECTOMY     Social History   Occupational History  . Not on file  Tobacco Use  . Smoking status: Current Every Day Smoker    Packs/day: 2.00    Years: 25.00    Pack years: 50.00    Types: Cigarettes  . Smokeless tobacco: Never Used  Substance and Sexual Activity  . Alcohol use: Yes    Comment: occasionally  . Drug use: No  . Sexual activity: Never

## 2017-10-27 NOTE — Telephone Encounter (Signed)
Note given to patient.

## 2017-11-01 ENCOUNTER — Other Ambulatory Visit (INDEPENDENT_AMBULATORY_CARE_PROVIDER_SITE_OTHER): Payer: Self-pay | Admitting: Orthopedic Surgery

## 2017-11-01 DIAGNOSIS — S83511A Sprain of anterior cruciate ligament of right knee, initial encounter: Secondary | ICD-10-CM

## 2017-11-02 NOTE — Pre-Procedure Instructions (Signed)
Craig [name]  11/02/2017      [facility] - [place], [place] - [address] [place] [address] Phone: [phone] Fax: [phone]  [id number] - [place], [place] - [address] AT [facility] OF [address] & [address] [place] [address] Phone: [phone] Fax: [phone]    Your procedure is scheduled on Thursday October 24.  Report to [facility] Ad[facility] at 12:00 P.M.  Call this number if you have problems the morning of surgery:  [phone]   Remember:  Do not eat or drink after midnight.    Take these medicines the morning of surgery with A SIP OF WATER:  Tramadol (ultram) if needed  7 days prior to surgery STOP taking any Aspirin(unless otherwise instructed by your surgeon), Aleve, Naproxen, Ibuprofen, Motrin, Advil, Goody's, BC's, all herbal medications, fish oil, and all vitamins]    Do not wear jewelry  Do not wear lotions, powders, or colognes, or deodorant.  Do not shave 48 hours prior to surgery.  Men may shave face and neck.  Do not bring valuables to the hospital.  [facility] is not responsible for any belongings or valuables.  Contacts, dentures or bridgework may not be worn into surgery.  Leave your suitcase in the car.  After surgery it may be brought to your room.  For patients admitted to the hospital, discharge time will be determined by your treatment team.  Patients discharged the day of surgery will not be allowed to drive home.    Special instructions:    [facility] Preparing For [facility]  Before surgery, you can play an important role. Because skin is not sterile, your skin needs to be as free of germs as possible. You can reduce the number of germs on your skin by washing with CHG (chlorahexidine gluconate) Soap before surgery.  CHG is an antiseptic cleaner which kills germs and bonds with the skin to continue killing germs even after washing.    Oral  Hygiene is also important to reduce your risk of infection.  Remember - BRUSH YOUR TEETH THE MORNING OF SURGERY WITH YOUR REGULAR TOOTHPASTE  Please do not use if you have an allergy to CHG or antibacterial soaps. If your skin becomes reddened/irritated stop using the CHG.  Do not shave (including legs and underarms) for at least 48 hours prior to first CHG shower. It is OK to shave your face.  Please follow these instructions carefully.   1. Shower the NIGHT BEFORE SURGERY and the MORNING OF SURGERY with CHG.   2. If you chose to wash your hair, wash your hair first as usual with your normal shampoo.  3. After you shampoo, rinse your hair and body thoroughly to remove the shampoo.  4. Use CHG as you would any other liquid soap. You can apply CHG directly to the skin and wash gently with a scrungie or a clean washcloth.   5. Apply the CHG Soap to your body ONLY FROM THE NECK DOWN.  Do not use on open wounds or open sores. Avoid contact with your eyes, ears, mouth and genitals (private parts). Wash Face and genitals (private parts)  with your normal soap.  6. Wash thoroughly, paying special attention to the area where your surgery will be performed.  7. Thoroughly rinse your body with warm water from the neck down.  8. DO NOT shower/wash with your normal soap  after using and rinsing off the CHG Soap.  9. Pat yourself dry with a CLEAN TOWEL.  10. Wear CLEAN PAJAMAS to bed the night before surgery, wear comfortable clothes the morning of surgery  11. Place CLEAN SHEETS on your bed the night of your first shower and DO NOT SLEEP WITH PETS.    Day of Surgery:  Do not apply any deodorants/lotions.  Please wear clean clothes to the hospital/surgery center.   Remember to brush your teeth WITH YOUR REGULAR TOOTHPASTE.    Please read over the following fact sheets that you were given. Coughing and Deep Breathing and Surgical Site Infection Prevention

## 2017-11-03 ENCOUNTER — Other Ambulatory Visit: Payer: Self-pay

## 2017-11-03 ENCOUNTER — Encounter (HOSPITAL_COMMUNITY): Payer: Self-pay

## 2017-11-03 ENCOUNTER — Encounter (HOSPITAL_COMMUNITY)
Admission: RE | Admit: 2017-11-03 | Discharge: 2017-11-03 | Disposition: A | Discharge status: Home / Self Care | Payer: Medicare Other | Source: Ambulatory Visit | Attending: Orthopedic Surgery | Admitting: Orthopedic Surgery

## 2017-11-03 DIAGNOSIS — Z01812 Encounter for preprocedural laboratory examination: Secondary | ICD-10-CM | POA: Diagnosis not present

## 2017-11-03 HISTORY — DX: Sprain of anterior cruciate ligament of unspecified knee, initial encounter: S83.519A

## 2017-11-03 HISTORY — DX: Dyspnea, unspecified: R06.00

## 2017-11-03 LAB — CBC
HCT: 53 % — ABNORMAL HIGH (ref 39.0–52.0)
Hemoglobin: 17.3 g/dL — ABNORMAL HIGH (ref 13.0–17.0)
MCH: 30.7 pg (ref 26.0–34.0)
MCHC: 32.6 g/dL (ref 30.0–36.0)
MCV: 94 fL (ref 80.0–100.0)
NRBC: 0 % (ref 0.0–0.2)
Platelets: 268 10*3/uL (ref 150–400)
RBC: 5.64 MIL/uL (ref 4.22–5.81)
RDW: 13.5 % (ref 11.5–15.5)
WBC: 10.6 10*3/uL — AB (ref 4.0–10.5)
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
## 2017-11-08 MED ORDER — BUPIVACAINE LIPOSOME 1.3 % IJ SUSP
20.0000 mL | INTRAMUSCULAR | Status: DC
  Filled 2017-11-08: qty 20

## 2017-11-09 ENCOUNTER — Encounter (HOSPITAL_COMMUNITY): Payer: Self-pay | Admitting: Urology

## 2017-11-09 ENCOUNTER — Ambulatory Visit (HOSPITAL_COMMUNITY): Payer: Medicare Other | Admitting: Anesthesiology

## 2017-11-09 ENCOUNTER — Encounter (INDEPENDENT_AMBULATORY_CARE_PROVIDER_SITE_OTHER): Payer: Self-pay | Admitting: Orthopedic Surgery

## 2017-11-09 ENCOUNTER — Encounter (HOSPITAL_COMMUNITY)
Admission: RE | Disposition: A | Discharge status: Home / Self Care | Payer: Self-pay | Source: Ambulatory Visit | Attending: Orthopedic Surgery

## 2017-11-09 ENCOUNTER — Ambulatory Visit (HOSPITAL_COMMUNITY)
Admission: RE | Admit: 2017-11-09 | Discharge: 2017-11-09 | Disposition: A | Discharge status: Home / Self Care | Payer: Medicare Other | Source: Ambulatory Visit | Attending: Orthopedic Surgery | Admitting: Orthopedic Surgery

## 2017-11-09 DIAGNOSIS — S83511A Sprain of anterior cruciate ligament of right knee, initial encounter: Secondary | ICD-10-CM | POA: Insufficient documentation

## 2017-11-09 DIAGNOSIS — Z7982 Long term (current) use of aspirin: Secondary | ICD-10-CM | POA: Diagnosis not present

## 2017-11-09 DIAGNOSIS — F209 Schizophrenia, unspecified: Secondary | ICD-10-CM | POA: Insufficient documentation

## 2017-11-09 DIAGNOSIS — S83241D Other tear of medial meniscus, current injury, right knee, subsequent encounter: Secondary | ICD-10-CM

## 2017-11-09 DIAGNOSIS — F17210 Nicotine dependence, cigarettes, uncomplicated: Secondary | ICD-10-CM | POA: Diagnosis not present

## 2017-11-09 DIAGNOSIS — X58XXXA Exposure to other specified factors, initial encounter: Secondary | ICD-10-CM | POA: Diagnosis not present

## 2017-11-09 DIAGNOSIS — G8918 Other acute postprocedural pain: Secondary | ICD-10-CM | POA: Diagnosis not present

## 2017-11-09 DIAGNOSIS — F419 Anxiety disorder, unspecified: Secondary | ICD-10-CM | POA: Diagnosis not present

## 2017-11-09 DIAGNOSIS — S83241A Other tear of medial meniscus, current injury, right knee, initial encounter: Secondary | ICD-10-CM | POA: Diagnosis not present

## 2017-11-09 DIAGNOSIS — Z79899 Other long term (current) drug therapy: Secondary | ICD-10-CM | POA: Insufficient documentation

## 2017-11-09 DIAGNOSIS — F319 Bipolar disorder, unspecified: Secondary | ICD-10-CM | POA: Insufficient documentation

## 2017-11-09 HISTORY — PX: ANTERIOR CRUCIATE LIGAMENT REPAIR: SHX115

## 2017-11-09 SURGERY — RECONSTRUCTION, KNEE, ACL
Anesthesia: General | Laterality: Right

## 2017-11-09 MED ORDER — PROMETHAZINE HCL 25 MG/ML IJ SOLN: 6.2500 mg | INTRAMUSCULAR | Status: DC | PRN

## 2017-11-09 MED ORDER — MORPHINE SULFATE (PF) 4 MG/ML IV SOLN
INTRAVENOUS | Status: DC | PRN
  Administered 2017-11-09: 8 mg

## 2017-11-09 MED ORDER — BUPIVACAINE-EPINEPHRINE (PF) 0.25% -1:200000 IJ SOLN
INTRAMUSCULAR | Status: AC
  Filled 2017-11-09: qty 30

## 2017-11-09 MED ORDER — VANCOMYCIN HCL IN DEXTROSE 1-5 GM/200ML-% IV SOLN
INTRAVENOUS | Status: AC
  Administered 2017-11-09: 1000 mg via INTRAVENOUS
  Filled 2017-11-09: qty 200

## 2017-11-09 MED ORDER — EPINEPHRINE PF 1 MG/ML IJ SOLN
INTRAMUSCULAR | Status: AC
  Filled 2017-11-09: qty 2

## 2017-11-09 MED ORDER — FENTANYL CITRATE (PF) 250 MCG/5ML IJ SOLN
INTRAMUSCULAR | Status: AC
  Filled 2017-11-09: qty 5

## 2017-11-09 MED ORDER — DEXAMETHASONE SODIUM PHOSPHATE 10 MG/ML IJ SOLN
INTRAMUSCULAR | Status: DC | PRN
  Administered 2017-11-09: 10 mg via INTRAVENOUS

## 2017-11-09 MED ORDER — LACTATED RINGERS IV SOLN
INTRAVENOUS | Status: DC
  Administered 2017-11-09 (×2): via INTRAVENOUS

## 2017-11-09 MED ORDER — HYDROCODONE-ACETAMINOPHEN 7.5-325 MG PO TABS
ORAL_TABLET | ORAL | Status: AC
  Filled 2017-11-09: qty 1

## 2017-11-09 MED ORDER — BUPIVACAINE-EPINEPHRINE (PF) 0.25% -1:200000 IJ SOLN
INTRAMUSCULAR | Status: DC | PRN
  Administered 2017-11-09: 30 mL via PERINEURAL

## 2017-11-09 MED ORDER — SODIUM CHLORIDE 0.9 % IV SOLN
INTRAVENOUS | Status: DC | PRN
  Administered 2017-11-09: 25 ug/min via INTRAVENOUS

## 2017-11-09 MED ORDER — GLYCOPYRROLATE 0.2 MG/ML IJ SOLN
INTRAMUSCULAR | Status: DC | PRN
  Administered 2017-11-09 (×2): 0.1 mg via INTRAVENOUS

## 2017-11-09 MED ORDER — HYDROCODONE-ACETAMINOPHEN 7.5-325 MG PO TABS
1.0000 | ORAL_TABLET | Freq: Once | ORAL | Status: AC | PRN
  Administered 2017-11-09: 1 via ORAL

## 2017-11-09 MED ORDER — HYDROMORPHONE HCL 1 MG/ML IJ SOLN
INTRAMUSCULAR | Status: AC
  Filled 2017-11-09: qty 1

## 2017-11-09 MED ORDER — HYDROMORPHONE HCL 1 MG/ML IJ SOLN: 0.2500 mg | INTRAMUSCULAR | Status: DC | PRN

## 2017-11-09 MED ORDER — FENTANYL CITRATE (PF) 100 MCG/2ML IJ SOLN
50.0000 ug | Freq: Once | INTRAMUSCULAR | Status: AC
  Administered 2017-11-09: 50 ug via INTRAVENOUS

## 2017-11-09 MED ORDER — CHLORHEXIDINE GLUCONATE 4 % EX LIQD: 60.0000 mL | Freq: Once | CUTANEOUS | Status: DC

## 2017-11-09 MED ORDER — PROPOFOL 10 MG/ML IV BOLUS
INTRAVENOUS | Status: DC | PRN
  Administered 2017-11-09: 180 mg via INTRAVENOUS
  Administered 2017-11-09: 50 mg via INTRAVENOUS

## 2017-11-09 MED ORDER — PHENYLEPHRINE 40 MCG/ML (10ML) SYRINGE FOR IV PUSH (FOR BLOOD PRESSURE SUPPORT)
PREFILLED_SYRINGE | INTRAVENOUS | Status: AC
  Filled 2017-11-09: qty 10

## 2017-11-09 MED ORDER — CLONIDINE HCL 0.2 MG PO TABS
0.2000 mg | ORAL_TABLET | Freq: Once | ORAL | Status: AC
  Administered 2017-11-09: 0.2 mg via ORAL
  Filled 2017-11-09: qty 1

## 2017-11-09 MED ORDER — CLONIDINE HCL (ANALGESIA) 100 MCG/ML EP SOLN
EPIDURAL | Status: DC | PRN
  Administered 2017-11-09: 1 mL

## 2017-11-09 MED ORDER — MEPERIDINE HCL 50 MG/ML IJ SOLN: 6.2500 mg | INTRAMUSCULAR | Status: DC | PRN

## 2017-11-09 MED ORDER — VANCOMYCIN HCL IN DEXTROSE 1-5 GM/200ML-% IV SOLN
1000.0000 mg | INTRAVENOUS | Status: AC
  Administered 2017-11-09: 1000 mg via INTRAVENOUS

## 2017-11-09 MED ORDER — MIDAZOLAM HCL 2 MG/2ML IJ SOLN
INTRAMUSCULAR | Status: AC
  Administered 2017-11-09: 2 mg via INTRAVENOUS
  Filled 2017-11-09: qty 2

## 2017-11-09 MED ORDER — 0.9 % SODIUM CHLORIDE (POUR BTL) OPTIME
TOPICAL | Status: DC | PRN
  Administered 2017-11-09: 1000 mL

## 2017-11-09 MED ORDER — LIDOCAINE 2% (20 MG/ML) 5 ML SYRINGE
INTRAMUSCULAR | Status: AC
  Filled 2017-11-09: qty 10

## 2017-11-09 MED ORDER — CLONIDINE HCL (ANALGESIA) 100 MCG/ML EP SOLN
EPIDURAL | Status: DC | PRN
  Administered 2017-11-09: 50 ug

## 2017-11-09 MED ORDER — LIDOCAINE 2% (20 MG/ML) 5 ML SYRINGE
INTRAMUSCULAR | Status: DC | PRN
  Administered 2017-11-09: 60 mg via INTRAVENOUS

## 2017-11-09 MED ORDER — MIDAZOLAM HCL 2 MG/2ML IJ SOLN
2.0000 mg | Freq: Once | INTRAMUSCULAR | Status: AC
  Administered 2017-11-09: 2 mg via INTRAVENOUS

## 2017-11-09 MED ORDER — ACETAMINOPHEN 500 MG PO TABS
1000.0000 mg | ORAL_TABLET | Freq: Once | ORAL | Status: AC
  Administered 2017-11-09: 1000 mg via ORAL
  Filled 2017-11-09: qty 2

## 2017-11-09 MED ORDER — GABAPENTIN 300 MG PO CAPS
300.0000 mg | ORAL_CAPSULE | Freq: Once | ORAL | Status: AC
  Administered 2017-11-09: 300 mg via ORAL
  Filled 2017-11-09: qty 1

## 2017-11-09 MED ORDER — ONDANSETRON HCL 4 MG/2ML IJ SOLN
INTRAMUSCULAR | Status: DC | PRN
  Administered 2017-11-09: 4 mg via INTRAVENOUS

## 2017-11-09 MED ORDER — ACETAMINOPHEN 10 MG/ML IV SOLN: 1000.0000 mg | Freq: Once | INTRAVENOUS | Status: DC | PRN

## 2017-11-09 MED ORDER — SODIUM CHLORIDE 0.9 % IR SOLN
Status: DC | PRN
  Administered 2017-11-09 (×4): 3000 mL

## 2017-11-09 MED ORDER — ROPIVACAINE HCL 5 MG/ML IJ SOLN
INTRAMUSCULAR | Status: DC | PRN
  Administered 2017-11-09: 30 mL via PERINEURAL

## 2017-11-09 MED ORDER — GLYCOPYRROLATE PF 0.2 MG/ML IJ SOSY
PREFILLED_SYRINGE | INTRAMUSCULAR | Status: AC
  Filled 2017-11-09: qty 1

## 2017-11-09 MED ORDER — FENTANYL CITRATE (PF) 100 MCG/2ML IJ SOLN
INTRAMUSCULAR | Status: DC | PRN
  Administered 2017-11-09: 100 ug via INTRAVENOUS
  Administered 2017-11-09 (×3): 50 ug via INTRAVENOUS

## 2017-11-09 MED ORDER — PROPOFOL 10 MG/ML IV BOLUS
INTRAVENOUS | Status: AC
  Filled 2017-11-09: qty 20

## 2017-11-09 MED ORDER — FENTANYL CITRATE (PF) 100 MCG/2ML IJ SOLN
INTRAMUSCULAR | Status: AC
  Administered 2017-11-09: 50 ug via INTRAVENOUS
  Filled 2017-11-09: qty 2

## 2017-11-09 MED ORDER — MORPHINE SULFATE (PF) 4 MG/ML IV SOLN
INTRAVENOUS | Status: AC
  Filled 2017-11-09: qty 2

## 2017-11-09 MED ORDER — PHENYLEPHRINE 40 MCG/ML (10ML) SYRINGE FOR IV PUSH (FOR BLOOD PRESSURE SUPPORT)
PREFILLED_SYRINGE | INTRAVENOUS | Status: DC | PRN
  Administered 2017-11-09 (×3): 40 ug via INTRAVENOUS
  Administered 2017-11-09: 80 ug via INTRAVENOUS

## 2017-11-09 SURGICAL SUPPLY — 83 items
ANCHOR BUTTON TIGHTROPE ACL RT (Orthopedic Implant) ×3 IMPLANT
BANDAGE ESMARK 6X9 LF (GAUZE/BANDAGES/DRESSINGS) ×1 IMPLANT
BLADE EXCALIBUR 4.0MM X 13CM (MISCELLANEOUS) ×1
BLADE EXCALIBUR 4.0X13 (MISCELLANEOUS) ×1 IMPLANT
BLADE SURG 10 STRL SS (BLADE) ×3 IMPLANT
BLADE SURG 15 STRL LF DISP TIS (BLADE) ×2 IMPLANT
BLADE SURG 15 STRL SS (BLADE) ×6
BNDG CMPR 9X6 STRL LF SNTH (GAUZE/BANDAGES/DRESSINGS) ×1
BNDG CMPR MED 10X6 ELC LF (GAUZE/BANDAGES/DRESSINGS) ×1
BNDG CMPR MED 15X6 ELC VLCR LF (GAUZE/BANDAGES/DRESSINGS) ×1
BNDG ELASTIC 6X10 VLCR STRL LF (GAUZE/BANDAGES/DRESSINGS) ×2 IMPLANT
BNDG ELASTIC 6X15 VLCR STRL LF (GAUZE/BANDAGES/DRESSINGS) ×3 IMPLANT
BNDG ESMARK 6X9 LF (GAUZE/BANDAGES/DRESSINGS) ×3
BURR OVAL 8 FLU 5.0MM X 13CM (MISCELLANEOUS) ×1
BURR OVAL 8 FLU 5.0X13 (MISCELLANEOUS) ×1 IMPLANT
CLOSURE STERI-STRIP 1/2X4 (GAUZE/BANDAGES/DRESSINGS) ×1
CLSR STERI-STRIP ANTIMIC 1/2X4 (GAUZE/BANDAGES/DRESSINGS) ×1 IMPLANT
COVER SURGICAL LIGHT HANDLE (MISCELLANEOUS) ×3 IMPLANT
COVER WAND RF STERILE (DRAPES) ×3 IMPLANT
CUFF TOURNIQUET SINGLE 34IN LL (TOURNIQUET CUFF) ×2 IMPLANT
DECANTER SPIKE VIAL GLASS SM (MISCELLANEOUS) ×3 IMPLANT
DISSECTOR  3.8MM X 13CM (MISCELLANEOUS) ×2
DISSECTOR 3.8MM X 13CM (MISCELLANEOUS) IMPLANT
DRAPE INCISE IOBAN 66X45 STRL (DRAPES) ×3 IMPLANT
DRAPE OEC MINIVIEW 54X84 (DRAPES) ×2 IMPLANT
DRAPE U-SHAPE 47X51 STRL (DRAPES) ×3 IMPLANT
DRILL FLIPCUTTER II 8.0MM (INSTRUMENTS) IMPLANT
DRSG PAD ABDOMINAL 8X10 ST (GAUZE/BANDAGES/DRESSINGS) ×9 IMPLANT
DW OUTFLOW CASSETTE/TUBE SET (MISCELLANEOUS) ×2 IMPLANT
ELECT REM PT RETURN 9FT ADLT (ELECTROSURGICAL) ×3
ELECTRODE REM PT RTRN 9FT ADLT (ELECTROSURGICAL) ×1 IMPLANT
FLIPCUTTER II 8.0MM (INSTRUMENTS) ×3
GAUZE SPONGE 4X4 12PLY STRL (GAUZE/BANDAGES/DRESSINGS) ×6 IMPLANT
GAUZE SPONGE 4X4 12PLY STRL LF (GAUZE/BANDAGES/DRESSINGS) ×2 IMPLANT
GAUZE XEROFORM 1X8 LF (GAUZE/BANDAGES/DRESSINGS) ×3 IMPLANT
GLOVE BIO SURGEON ST LM GN SZ9 (GLOVE) ×3 IMPLANT
GLOVE BIOGEL PI IND STRL 8 (GLOVE) ×1 IMPLANT
GLOVE BIOGEL PI INDICATOR 8 (GLOVE) ×4
GLOVE SURG ORTHO 8.0 STRL STRW (GLOVE) ×5 IMPLANT
GLOVE SURG SS PI 6.5 STRL IVOR (GLOVE) ×8 IMPLANT
GLOVE SURG SS PI 8.0 STRL IVOR (GLOVE) ×2 IMPLANT
GOWN STRL REUS W/ TWL LRG LVL3 (GOWN DISPOSABLE) ×3 IMPLANT
GOWN STRL REUS W/ TWL XL LVL3 (GOWN DISPOSABLE) ×1 IMPLANT
GOWN STRL REUS W/TWL LRG LVL3 (GOWN DISPOSABLE) ×9
GOWN STRL REUS W/TWL XL LVL3 (GOWN DISPOSABLE) ×6
KIT BASIN OR (CUSTOM PROCEDURE TRAY) ×3 IMPLANT
KIT BIOCARTILAGE DEL W/SYRINGE (KITS) ×3 IMPLANT
KIT BIOCARTILAGE LG JOINT MIX (KITS) ×2 IMPLANT
KIT TURNOVER KIT B (KITS) ×3 IMPLANT
MANIFOLD NEPTUNE II (INSTRUMENTS) ×3 IMPLANT
NDL 18GX1X1/2 (RX/OR ONLY) (NEEDLE) ×1 IMPLANT
NEEDLE 18GX1X1/2 (RX/OR ONLY) (NEEDLE) ×12 IMPLANT
NS IRRIG 1000ML POUR BTL (IV SOLUTION) ×3 IMPLANT
PACK ARTHROSCOPY DSU (CUSTOM PROCEDURE TRAY) ×3 IMPLANT
PAD ARMBOARD 7.5X6 YLW CONV (MISCELLANEOUS) ×6 IMPLANT
PAD CAST 4YDX4 CTTN HI CHSV (CAST SUPPLIES) ×1 IMPLANT
PADDING CAST COTTON 4X4 STRL (CAST SUPPLIES) ×3
PADDING CAST COTTON 6X4 STRL (CAST SUPPLIES) ×5 IMPLANT
PENCIL BUTTON HOLSTER BLD 10FT (ELECTRODE) ×2 IMPLANT
PK GRAFTLINK AUTO IMPLANT SYST (Anchor) ×3 IMPLANT
PORT APPOLLO RF 90DEGREE MULTI (SURGICAL WAND) ×2 IMPLANT
PUTTY DBM ALLOSYNC PURE 5CC (Putty) ×2 IMPLANT
SPONGE LAP 4X18 RFD (DISPOSABLE) ×6 IMPLANT
SUCTION FRAZIER HANDLE 10FR (MISCELLANEOUS) ×2
SUCTION TUBE FRAZIER 10FR DISP (MISCELLANEOUS) ×1 IMPLANT
SUT 2 FIBERLOOP 20 STRT BLUE (SUTURE)
SUT ETHILON 3 0 PS 1 (SUTURE) ×3 IMPLANT
SUT MENISCAL KIT (KITS) IMPLANT
SUT MNCRL AB 3-0 PS2 18 (SUTURE) ×2 IMPLANT
SUT VIC AB 0 CT1 27 (SUTURE) ×3
SUT VIC AB 0 CT1 27XBRD ANBCTR (SUTURE) ×1 IMPLANT
SUT VIC AB 2-0 CT1 27 (SUTURE) ×3
SUT VIC AB 2-0 CT1 TAPERPNT 27 (SUTURE) ×1 IMPLANT
SUTURE 2 FIBERLOOP 20 STRT BLU (SUTURE) ×1 IMPLANT
SYR 30ML LL (SYRINGE) ×3 IMPLANT
SYR BULB IRRIGATION 50ML (SYRINGE) ×3 IMPLANT
SYR TB 1ML LUER SLIP (SYRINGE) ×7 IMPLANT
SYSTEM GRAFT IMPLANT AUTOGRAFT (Anchor) ×1 IMPLANT
TOWEL OR 17X24 6PK STRL BLUE (TOWEL DISPOSABLE) ×3 IMPLANT
TOWEL OR 17X26 10 PK STRL BLUE (TOWEL DISPOSABLE) ×6 IMPLANT
UNDERPAD 30X30 (UNDERPADS AND DIAPERS) ×3 IMPLANT
WATER STERILE IRR 1000ML POUR (IV SOLUTION) ×3 IMPLANT
WRAP KNEE MAXI GEL POST OP (GAUZE/BANDAGES/DRESSINGS) ×3 IMPLANT

## 2017-11-09 NOTE — Anesthesia Procedure Notes (Signed)
Procedure Name: LMA Insertion Date/Time: 11/09/2017 2:17 PM Performed by: Marny Lowenstein, CRNA Pre-anesthesia Checklist: Patient identified, Emergency Drugs available, Suction available and Patient being monitored Patient Re-evaluated:Patient Re-evaluated prior to induction Oxygen Delivery Method: Circle system utilized Preoxygenation: Pre-oxygenation with 100% oxygen Induction Type: IV induction Ventilation: Mask ventilation without difficulty and Oral airway inserted - appropriate to patient size LMA: LMA with gastric port inserted LMA Size: 4.0 Number of attempts: 1 Placement Confirmation: positive ETCO2 and breath sounds checked- equal and bilateral Tube secured with: Tape Dental Injury: Teeth and Oropharynx as per pre-operative assessment

## 2017-11-09 NOTE — Anesthesia Preprocedure Evaluation (Addendum)
Anesthesia Evaluation  Patient identified by MRN, date of birth, ID band Patient awake    Reviewed: Allergy & Precautions, NPO status , Patient's Chart, lab work & pertinent test results  Airway Mallampati: II  TM Distance: >3 FB Neck ROM: Full    Dental no notable dental hx. (+) Teeth Intact, Dental Advisory Given   Pulmonary Current Smoker,    Pulmonary exam normal breath sounds clear to auscultation       Cardiovascular Exercise Tolerance: Good negative cardio ROS Normal cardiovascular exam Rhythm:Regular Rate:Normal     Neuro/Psych Bipolar Disorder  Neuromuscular disease negative neurological ROS     GI/Hepatic negative GI ROS, Neg liver ROS,   Endo/Other  negative endocrine ROS  Renal/GU negative Renal ROS     Musculoskeletal negative musculoskeletal ROS (+)   Abdominal (+) + obese,   Peds  Hematology negative hematology ROS (+)   Anesthesia Other Findings   Reproductive/Obstetrics                            Anesthesia Physical Anesthesia Plan  ASA: II  Anesthesia Plan: General   Post-op Pain Management:  Regional for Post-op pain   Induction: Intravenous  PONV Risk Score and Plan: Treatment may vary due to age or medical condition, Ondansetron and Dexamethasone  Airway Management Planned: LMA  Additional Equipment:   Intra-op Plan:   Post-operative Plan:   Informed Consent: I have reviewed the patients History and Physical, chart, labs and discussed the procedure including the risks, benefits and alternatives for the proposed anesthesia with the patient or authorized representative who has indicated his/her understanding and acceptance.   Dental advisory given  Plan Discussed with:   Anesthesia Plan Comments:         Anesthesia Quick Evaluation

## 2017-11-09 NOTE — Progress Notes (Signed)
Orthopedic Tech Progress Note Patient Details:  Craig [name] 1975/01/26 [id number]  Ortho Devices Type of Ortho Device: Crutches, Bone foam zero knee Ortho Device/Splint Interventions: Application   Post Interventions Patient Tolerated: Well Instructions Provided: Care of device, Adjustment of device   Saul Fordyce 11/09/2017, 6:05 PM
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
## 2017-11-09 NOTE — Op Note (Signed)
NAME: Craig [name], Craig [name] MEDICAL RECORD ZO:[id number] ACCOUNT [id number] DATE OF BIRTH:September 06, 1975 FACILITY: [facility] LOCATION: [facility] PHYSICIAN:Jovon Streetman Diamantina Providence, MD  OPERATIVE REPORT  DATE OF PROCEDURE:  11/09/2017  PREOPERATIVE DIAGNOSIS:  Right knee anterior cruciate ligament tear and medial meniscal tear.  POSTOPERATIVE DIAGNOSIS:  Right knee anterior cruciate ligament tear and medial meniscal tear.  PROCEDURE:  Right knee anterior cruciate ligament reconstruction, hamstring autograft Arthrex dual Endobutton suspension fixation with bone grafting and partial medial meniscectomy.  SURGEON:  Cammy Copa, MD  ASSISTANT:  April Green, RNFA.  INDICATIONS:  Craig [name] is a 42 year old patient with right knee instability who presents for operative management after explanation of risks and benefits.  PROCEDURE IN DETAIL:  The patient was brought to the operating room where general endotracheal anesthesia was induced.  Preoperative antibiotics administered.  Timeout was called.  Right leg was examined under anesthesia and found to have significant ACL  laxity.  Collaterals were stable to varus and valgus stress at 0 and 30 degrees.  There is no posterolateral rotatory instability noted.  Range of motion was full.  After sterile prepping and draping, the operative field was covered with Ioban.  After  timeout was called, the portals were anesthetized with Marcaine with epinephrine.  The graft was then harvested.  Incision made over the pes tendon bursa.  Semitendinosis was harvested and prepared on the back table using Arthrex dual Endobutton  technique.  An 8 mm graft was achieved.  Concurrent with this, the anterior inferolateral and anterior inferomedial portals were established.  Diagnostic arthroscopy was performed.  The patient had an intact patellofemoral compartment.  No loose bodies  medial and lateral gutter.  The lateral compartment articular cartilage and meniscus was intact.  ACL was  torn.  PCL was intact.  Medial meniscus was also torn in a significant fashion involving about 60% anterior, posterior width of the meniscus.  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  Notchplasty was performed.  ACL was debrided.  Partial medial meniscectomy was performed with the basket punch and shaver.  At this  time, the Arthrex FlipCutter 8 mm guide was used.  An 8 mm tunnel was drilled on that lateral femoral condyle and the 9 o'clock position.  Tibial tunnel drilled in the posterior aspect of the native ACL footprint.  The graft was passed after filling the  tunnels with bone graft.  Secure fixation achieved in full extension with at least 20 mm of graft in each tunnel.  A nice tight fit in the tunnel was achieved.  At this time, thorough irrigation was performed.  Knee was taken through a range of motion  and found to have good stability.  Fluoroscopy was utilized to make sure that the button flipped nicely on the lateral epicondyle.  At this time, thorough irrigation of the knee joint was performed.  Instruments were removed and then the harvest site was  closed using 0 Vicryl suture, 2-0 Vicryl suture and a 3-0 Monocryl.  Portals were closed using 2-0 Vicryl and 3-0 nylon.  A solution of Marcaine, morphine, clonidine used to anesthetize both the knee and the portal sites.  Impervious dressings placed.   The bulky dressing and knee immobilizer placed.  The patient tolerated the procedure well without immediate complications.  He was transferred to the recovery room in stable condition.  TN/NUANCE  D:11/09/2017 T:11/09/2017 JOB:[id number]

## 2017-11-09 NOTE — Transfer of Care (Signed)
Immediate Anesthesia Transfer of Care Note  Patient: Craig [name]  Procedure(s) Performed: RIGHT KNEE ANTERIOR CRUCIATE LIGAMENT (ACL) RECONSTRUCTION, PARTIAL MEDIAL MENISCECTOMY (Right )  Patient Location: PACU  Anesthesia Type:General  Level of Consciousness: awake and drowsy  Airway & Oxygen Therapy: Patient Spontanous Breathing and Patient connected to face mask oxygen  Post-op Assessment: Report given to RN and Post -op Vital signs reviewed and stable  Post vital signs: Reviewed and stable  Last Vitals:  Vitals Value Taken Time  BP 102/60 11/09/2017  4:49 PM  Temp    Pulse 65 11/09/2017  4:51 PM  Resp 13 11/09/2017  4:51 PM  SpO2 96 % 11/09/2017  4:51 PM  Vitals shown include unvalidated device data.  Last Pain:  Vitals:   11/09/17 1149  TempSrc:   PainSc: 0-No pain         Complications: No apparent anesthesia complications

## 2017-11-09 NOTE — Anesthesia Procedure Notes (Signed)
Anesthesia Regional Block: Adductor canal block   Pre-Anesthetic Checklist: ,, timeout performed, Correct Patient, Correct Site, Correct Laterality, Correct Procedure, Correct Position, site marked, Risks and benefits discussed,  Surgical consent,  Pre-op evaluation,  At surgeon's request and post-op pain management  Laterality: Right  Prep: Maximum Sterile Barrier Precautions used, chloraprep       Needles:  Injection technique: Single-shot  Needle Type: Echogenic Needle     Needle Length: 9cm  Needle Gauge: 21     Additional Needles:   Procedures:,,,, ultrasound used (permanent image in chart),,,,  Narrative:  Start time: 11/09/2017 1:04 PM End time: 11/09/2017 1:10 PM Injection made incrementally with aspirations every 5 mL.  Performed by: Personally  Anesthesiologist: Trevor Iha, MD  Additional Notes: Pt tolerated procedure well. Block performed at the request of Dr. August Saucer for post operative pain relief

## 2017-11-09 NOTE — Brief Op Note (Signed)
11/09/2017  4:50 PM  PATIENT:  Craig [name]  42 y.o. male  PRE-OPERATIVE DIAGNOSIS:  anterior cruciate ligament tear, medial meniscal tear right knee  POST-OPERATIVE DIAGNOSIS:  anterior cruciate ligament tear, medial meniscal tear right knee  PROCEDURE:  Procedure(s): RIGHT KNEE ANTERIOR CRUCIATE LIGAMENT (ACL) RECONSTRUCTION, PARTIAL MEDIAL MENISCECTOMY  SURGEON:  Surgeon(s): Cammy Copa, MD  ASSISTANT: Chilton Si rnfa  ANESTHESIA:   general  EBL: 15 ml    Total I/O In: 1000 [I.V.:1000] Out: 25 [Blood:25]  BLOOD ADMINISTERED: none  DRAINS: none   LOCAL MEDICATIONS USED:  Marcaine mso4 clonidine  SPECIMEN:  No Specimen  COUNTS:  YES  TOURNIQUET:  * Missing tourniquet times found for documented tourniquets in log: [id number] *  DICTATION: .Other Dictation: Dictation Number [id number]  PLAN OF CARE: Discharge to home after PACU  PATIENT DISPOSITION:  PACU - hemodynamically stable

## 2017-11-10 ENCOUNTER — Telehealth (INDEPENDENT_AMBULATORY_CARE_PROVIDER_SITE_OTHER): Payer: Self-pay | Admitting: Orthopedic Surgery

## 2017-11-10 ENCOUNTER — Encounter (HOSPITAL_COMMUNITY): Payer: Self-pay | Admitting: Orthopedic Surgery

## 2017-11-10 NOTE — Telephone Encounter (Signed)
thx

## 2017-11-10 NOTE — Anesthesia Postprocedure Evaluation (Signed)
Anesthesia Post Note  Patient: Craig [name]  Procedure(s) Performed: RIGHT KNEE ANTERIOR CRUCIATE LIGAMENT (ACL) RECONSTRUCTION, PARTIAL MEDIAL MENISCECTOMY (Right )     Patient location during evaluation: Other Anesthesia Type: General Level of consciousness: awake and alert Pain management: pain level controlled Vital Signs Assessment: post-procedure vital signs reviewed and stable Respiratory status: spontaneous breathing, nonlabored ventilation, respiratory function stable and patient connected to nasal cannula oxygen Cardiovascular status: blood pressure returned to baseline and stable Postop Assessment: no apparent nausea or vomiting Anesthetic complications: no    Last Vitals:  Vitals:   11/09/17 1735 11/09/17 1750  BP: 92/60 92/63  Pulse: 72 73  Resp: 15 15  Temp:  36.7 C  SpO2: 100% 100%    Last Pain:  Vitals:   11/09/17 1750  TempSrc:   PainSc: Asleep                 Shelton Silvas

## 2017-11-10 NOTE — Telephone Encounter (Signed)
FYI IC s/w patient advised ok to take robaxin, pain medication as needed BT's should be taken twice daily (am and pm) Advised ok to d/c ace and cotton wrap but leave bandage over incision.

## 2017-11-10 NOTE — Telephone Encounter (Signed)
Patient had surgery yesterday and needs clarification on if he should be taking methocarbamol? He said he was given a prescription for it but on the discharge it said to stop taking following medications: goody powders, tramadol, methocarbamol, hydrocodone, and tramadol. What should he be taking?  Also, patient asked if he can take white gauze off underneath his bandage?  Please advise # [phone]

## 2017-11-11 ENCOUNTER — Encounter (HOSPITAL_COMMUNITY): Payer: Self-pay | Admitting: Emergency Medicine

## 2017-11-11 ENCOUNTER — Emergency Department (HOSPITAL_COMMUNITY): Payer: Medicare Other

## 2017-11-11 ENCOUNTER — Emergency Department (HOSPITAL_COMMUNITY)
Admission: EM | Admit: 2017-11-11 | Discharge: 2017-11-11 | Disposition: A | Discharge status: Home / Self Care | Payer: Medicare Other | Attending: Emergency Medicine | Admitting: Emergency Medicine

## 2017-11-11 ENCOUNTER — Other Ambulatory Visit: Payer: Self-pay

## 2017-11-11 DIAGNOSIS — J40 Bronchitis, not specified as acute or chronic: Secondary | ICD-10-CM

## 2017-11-11 DIAGNOSIS — R0600 Dyspnea, unspecified: Secondary | ICD-10-CM | POA: Diagnosis not present

## 2017-11-11 DIAGNOSIS — R0602 Shortness of breath: Secondary | ICD-10-CM | POA: Diagnosis not present

## 2017-11-11 DIAGNOSIS — F17210 Nicotine dependence, cigarettes, uncomplicated: Secondary | ICD-10-CM | POA: Insufficient documentation

## 2017-11-11 LAB — CBC WITH DIFFERENTIAL/PLATELET
Abs Immature Granulocytes: 0.05 10*3/uL (ref 0.00–0.07)
BASOS ABS: 0 10*3/uL (ref 0.0–0.1)
BASOS PCT: 0 %
EOS PCT: 1 %
Eosinophils Absolute: 0.1 10*3/uL (ref 0.0–0.5)
HCT: 46 % (ref 39.0–52.0)
Hemoglobin: 14.7 g/dL (ref 13.0–17.0)
Immature Granulocytes: 0 %
Lymphocytes Relative: 22 %
Lymphs Abs: 3.3 10*3/uL (ref 0.7–4.0)
MCH: 29.5 pg (ref 26.0–34.0)
MCHC: 32 g/dL (ref 30.0–36.0)
MCV: 92.4 fL (ref 80.0–100.0)
MONO ABS: 1.5 10*3/uL — AB (ref 0.1–1.0)
Monocytes Relative: 10 %
NRBC: 0 % (ref 0.0–0.2)
Neutro Abs: 9.9 10*3/uL — ABNORMAL HIGH (ref 1.7–7.7)
Neutrophils Relative %: 67 %
PLATELETS: 221 10*3/uL (ref 150–400)
RBC: 4.98 MIL/uL (ref 4.22–5.81)
RDW: 13.7 % (ref 11.5–15.5)
WBC: 14.8 10*3/uL — ABNORMAL HIGH (ref 4.0–10.5)

## 2017-11-11 LAB — COMPREHENSIVE METABOLIC PANEL
ALBUMIN: 3.7 g/dL (ref 3.5–5.0)
ALT: 24 U/L (ref 0–44)
AST: 18 U/L (ref 15–41)
Alkaline Phosphatase: 72 U/L (ref 38–126)
Anion gap: 7 (ref 5–15)
BUN: 12 mg/dL (ref 6–20)
CALCIUM: 8.7 mg/dL — AB (ref 8.9–10.3)
CHLORIDE: 103 mmol/L (ref 98–111)
CO2: 23 mmol/L (ref 22–32)
Creatinine, Ser: 0.96 mg/dL (ref 0.61–1.24)
GFR calc non Af Amer: >60 mL/min (ref >60)
Glucose, Bld: 114 mg/dL — ABNORMAL HIGH (ref 70–99)
POTASSIUM: 3.5 mmol/L (ref 3.5–5.1)
SODIUM: 133 mmol/L — AB (ref 135–145)
Total Bilirubin: 0.5 mg/dL (ref 0.3–1.2)
Total Protein: 6.7 g/dL (ref 6.5–8.1)

## 2017-11-11 LAB — TROPONIN I: Troponin I: <0.03 ng/mL (ref <0.03)

## 2017-11-11 LAB — D-DIMER, QUANTITATIVE (NOT AT ARMC): D DIMER QUANT: 0.86 ug{FEU}/mL — AB (ref 0.00–0.50)

## 2017-11-11 MED ORDER — HYDROMORPHONE HCL 1 MG/ML IJ SOLN
INTRAMUSCULAR | Status: AC
  Filled 2017-11-11: qty 1

## 2017-11-11 MED ORDER — ALBUTEROL SULFATE HFA 108 (90 BASE) MCG/ACT IN AERS
2.0000 | INHALATION_SPRAY | Freq: Once | RESPIRATORY_TRACT | Status: AC
  Administered 2017-11-11: 2 via RESPIRATORY_TRACT
  Filled 2017-11-11: qty 6.7

## 2017-11-11 MED ORDER — PREDNISONE 20 MG PO TABS: ORAL_TABLET | ORAL | 0 refills | Status: DC

## 2017-11-11 MED ORDER — METHYLPREDNISOLONE SODIUM SUCC 125 MG IJ SOLR
125.0000 mg | Freq: Once | INTRAMUSCULAR | Status: AC
  Administered 2017-11-11: 125 mg via INTRAVENOUS
  Filled 2017-11-11: qty 2

## 2017-11-11 MED ORDER — FENTANYL CITRATE (PF) 100 MCG/2ML IJ SOLN
50.0000 ug | Freq: Once | INTRAMUSCULAR | Status: AC
  Administered 2017-11-11: 50 ug via INTRAVENOUS
  Filled 2017-11-11: qty 2

## 2017-11-11 MED ORDER — HYDROMORPHONE HCL 1 MG/ML IJ SOLN
1.0000 mg | Freq: Once | INTRAMUSCULAR | Status: AC
  Administered 2017-11-11: 1 mg via INTRAVENOUS

## 2017-11-11 MED ORDER — KETOROLAC TROMETHAMINE 30 MG/ML IJ SOLN
30.0000 mg | Freq: Once | INTRAMUSCULAR | Status: AC
  Administered 2017-11-11: 30 mg via INTRAVENOUS
  Filled 2017-11-11: qty 1

## 2017-11-11 MED ORDER — IPRATROPIUM-ALBUTEROL 0.5-2.5 (3) MG/3ML IN SOLN
3.0000 mL | Freq: Once | RESPIRATORY_TRACT | Status: AC
  Administered 2017-11-11: 3 mL via RESPIRATORY_TRACT
  Filled 2017-11-11: qty 3

## 2017-11-11 MED ORDER — HYDROMORPHONE HCL 1 MG/ML IJ SOLN
1.0000 mg | Freq: Once | INTRAMUSCULAR | Status: AC
  Administered 2017-11-11: 1 mg via INTRAVENOUS
  Filled 2017-11-11: qty 1

## 2017-11-11 MED ORDER — IOPAMIDOL (ISOVUE-370) INJECTION 76%
100.0000 mL | Freq: Once | INTRAVENOUS | Status: AC | PRN
  Administered 2017-11-11: 100 mL via INTRAVENOUS

## 2017-11-11 NOTE — ED Triage Notes (Signed)
Patient had right knee surgery yesterday. Patient complaining of shortness of breath tonight with right knee pain.
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
## 2017-11-11 NOTE — ED Notes (Signed)
Respiratory notified about inhaler.  

## 2017-11-13 ENCOUNTER — Telehealth (INDEPENDENT_AMBULATORY_CARE_PROVIDER_SITE_OTHER): Payer: Self-pay | Admitting: Orthopedic Surgery

## 2017-11-13 NOTE — Telephone Encounter (Signed)
Okay to refill 1 week postop he should have at least 1 week supply

## 2017-11-13 NOTE — Telephone Encounter (Signed)
Ok for colace what was his post op rx

## 2017-11-13 NOTE — Telephone Encounter (Signed)
Oxy 5 and robaxin

## 2017-11-13 NOTE — Telephone Encounter (Signed)
Patient called requesting an RX for pain and also something to make him go to the bathroom.  Patient also said that when he tries to bend his leg, it burns.  CB#[phone].  Thank you.

## 2017-11-13 NOTE — Telephone Encounter (Signed)
Please advise. Thanks.  

## 2017-11-14 NOTE — Telephone Encounter (Signed)
ok 

## 2017-11-14 NOTE — Telephone Encounter (Signed)
Went to ER night after surgery because he was unable to breathe, was diagnosed with bronchitis. Taking 2 oxy at least every 6 hours (mainly at night) He said he  has plenty of medication but having a hard time dealing with pain. C/o burning with flexion when trying to do his bending exercises.

## 2017-11-15 NOTE — Telephone Encounter (Signed)
IC s/w patient and advised  

## 2017-11-15 NOTE — Telephone Encounter (Signed)
He is asking if you will give him anything stronger.

## 2017-11-15 NOTE — Telephone Encounter (Signed)
n

## 2017-11-17 ENCOUNTER — Ambulatory Visit (INDEPENDENT_AMBULATORY_CARE_PROVIDER_SITE_OTHER): Payer: Medicare Other | Admitting: Orthopedic Surgery

## 2017-11-17 ENCOUNTER — Encounter (INDEPENDENT_AMBULATORY_CARE_PROVIDER_SITE_OTHER): Payer: Self-pay | Admitting: Orthopedic Surgery

## 2017-11-17 DIAGNOSIS — S83511A Sprain of anterior cruciate ligament of right knee, initial encounter: Secondary | ICD-10-CM
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
## 2017-11-20 ENCOUNTER — Other Ambulatory Visit (INDEPENDENT_AMBULATORY_CARE_PROVIDER_SITE_OTHER): Payer: Self-pay | Admitting: Physician Assistant

## 2017-11-20 NOTE — Telephone Encounter (Signed)
Ok to rf? 

## 2017-11-20 NOTE — Telephone Encounter (Signed)
Dean patient 

## 2017-11-20 NOTE — Telephone Encounter (Signed)
He should only take for 3 w then done

## 2017-11-30 ENCOUNTER — Emergency Department (HOSPITAL_COMMUNITY)
Admission: EM | Admit: 2017-11-30 | Discharge: 2017-11-30 | Disposition: A | Discharge status: Home / Self Care | Payer: Medicare Other | Attending: Emergency Medicine | Admitting: Emergency Medicine

## 2017-11-30 ENCOUNTER — Encounter (HOSPITAL_COMMUNITY): Payer: Self-pay | Admitting: Emergency Medicine

## 2017-11-30 ENCOUNTER — Other Ambulatory Visit: Payer: Self-pay

## 2017-11-30 ENCOUNTER — Emergency Department (HOSPITAL_COMMUNITY): Payer: Medicare Other

## 2017-11-30 DIAGNOSIS — M25461 Effusion, right knee: Secondary | ICD-10-CM | POA: Diagnosis not present

## 2017-11-30 DIAGNOSIS — F209 Schizophrenia, unspecified: Secondary | ICD-10-CM | POA: Diagnosis not present

## 2017-11-30 DIAGNOSIS — F319 Bipolar disorder, unspecified: Secondary | ICD-10-CM | POA: Diagnosis not present

## 2017-11-30 DIAGNOSIS — F17210 Nicotine dependence, cigarettes, uncomplicated: Secondary | ICD-10-CM | POA: Insufficient documentation

## 2017-11-30 DIAGNOSIS — F419 Anxiety disorder, unspecified: Secondary | ICD-10-CM | POA: Insufficient documentation

## 2017-11-30 DIAGNOSIS — M25561 Pain in right knee: Secondary | ICD-10-CM | POA: Diagnosis not present

## 2017-11-30 MED ORDER — OXYCODONE HCL 5 MG PO TABS
10.0000 mg | ORAL_TABLET | Freq: Once | ORAL | Status: AC
  Administered 2017-11-30: 10 mg via ORAL
  Filled 2017-11-30: qty 2
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
## 2017-11-30 NOTE — ED Triage Notes (Signed)
Pt c/o RT knee pain. States he moved the wrong way and feels like it is out of place. States that he had surgery on it 10/24.

## 2017-11-30 NOTE — Discharge Instructions (Addendum)
You were evaluated in the Emergency Department and after careful evaluation, we did not find any emergent condition requiring admission or further testing in the hospital.  Your symptoms today seem to be due to a knee effusion.  Your x-ray did not show any other abnormalities.  It is very important that you follow-up with your orthopedic surgeon tomorrow for evaluation.  Please return to the Emergency Department if you experience any worsening of your condition.  We encourage you to follow up with a primary care provider.  Thank you for allowing us to be a part of your care.

## 2017-11-30 NOTE — ED Notes (Signed)
Assisted pt into bed, Pt in pain, can not bear weight

## 2017-11-30 NOTE — ED Notes (Signed)
Patient given discharge instruction, verbalized understand. Patient wheelchair out of the department.  

## 2017-12-01 ENCOUNTER — Encounter (INDEPENDENT_AMBULATORY_CARE_PROVIDER_SITE_OTHER): Payer: Self-pay | Admitting: Orthopedic Surgery

## 2017-12-01 ENCOUNTER — Ambulatory Visit (INDEPENDENT_AMBULATORY_CARE_PROVIDER_SITE_OTHER): Payer: Medicare Other | Admitting: Orthopedic Surgery

## 2017-12-01 DIAGNOSIS — S83511A Sprain of anterior cruciate ligament of right knee, initial encounter: Secondary | ICD-10-CM
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
## 2017-12-04 ENCOUNTER — Telehealth (INDEPENDENT_AMBULATORY_CARE_PROVIDER_SITE_OTHER): Payer: Self-pay | Admitting: Orthopedic Surgery

## 2017-12-04 ENCOUNTER — Telehealth (INDEPENDENT_AMBULATORY_CARE_PROVIDER_SITE_OTHER): Payer: Self-pay | Admitting: Radiology

## 2017-12-04 NOTE — Telephone Encounter (Signed)
IC advised should be for #30 1 po q 8 hrs prn pain 5mg 

## 2017-12-04 NOTE — Telephone Encounter (Signed)
Patient called and wanted to know if Dr. August Saucerean would release him to go back to work.  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 CB#[phone].  Thank you.

## 2017-12-04 NOTE — Telephone Encounter (Signed)
IC advised could pick up at front desk.  

## 2017-12-04 NOTE — Telephone Encounter (Signed)
Please advise. Thanks.  

## 2017-12-04 NOTE — Telephone Encounter (Signed)
Ok for that

## 2017-12-04 NOTE — Telephone Encounter (Signed)
Pharmacy called stated patient brought in Rx from 11/5 by Dr. August Saucerean for oxycodone #30 to be filled. Stated strength was not noted.  [facility] [phone]

## 2017-12-05 ENCOUNTER — Telehealth (INDEPENDENT_AMBULATORY_CARE_PROVIDER_SITE_OTHER): Payer: Self-pay

## 2017-12-05 ENCOUNTER — Other Ambulatory Visit: Payer: Self-pay

## 2017-12-05 ENCOUNTER — Encounter (HOSPITAL_COMMUNITY): Payer: Self-pay

## 2017-12-05 ENCOUNTER — Ambulatory Visit (HOSPITAL_COMMUNITY): Payer: Medicare Other | Attending: Orthopedic Surgery

## 2017-12-05 DIAGNOSIS — M6281 Muscle weakness (generalized): Secondary | ICD-10-CM | POA: Diagnosis not present

## 2017-12-05 DIAGNOSIS — M25561 Pain in right knee: Secondary | ICD-10-CM | POA: Diagnosis not present

## 2017-12-05 DIAGNOSIS — R262 Difficulty in walking, not elsewhere classified: Secondary | ICD-10-CM | POA: Insufficient documentation

## 2017-12-05 DIAGNOSIS — M25661 Stiffness of right knee, not elsewhere classified: Secondary | ICD-10-CM | POA: Insufficient documentation
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
## 2017-12-05 NOTE — Telephone Encounter (Signed)
Brooke, I spoke with you this morning about this patient. See response from Dr August Saucerean in regards to restrictions for patient. Let us know if you need anything.

## 2017-12-05 NOTE — Telephone Encounter (Signed)
Brooke P.T. With [facility] called wanting to know what protocol you want them to follow for this patients therapy. Please advise. [phone]

## 2017-12-05 NOTE — Patient Instructions (Signed)
Access Code: [email or web address]  URL: [email or web address]  Date: 12/05/2017  Prepared by: Jac CanavanBrooke Roshonda Sperl   Exercises Supine Quadricep Sets - 10 reps - 3 sets - 5 hold - 1x daily - 7x weekly Supine Heel Slide with Strap - 10 reps - 3 sets - 5-10sec hold - 1x daily - 7x weekly

## 2017-12-05 NOTE — Telephone Encounter (Signed)
At this point he is fine to be weightbearing as tolerated.  May need to work on achieving full extension and full flexion with home exercise program.  Closed chain quad strengthening exercises also indicated until his return office visit

## 2017-12-07 ENCOUNTER — Encounter (HOSPITAL_COMMUNITY): Payer: Self-pay

## 2017-12-07 ENCOUNTER — Ambulatory Visit (HOSPITAL_COMMUNITY): Payer: Medicare Other

## 2017-12-07 DIAGNOSIS — M25561 Pain in right knee: Secondary | ICD-10-CM

## 2017-12-07 DIAGNOSIS — R262 Difficulty in walking, not elsewhere classified: Secondary | ICD-10-CM

## 2017-12-07 DIAGNOSIS — M25661 Stiffness of right knee, not elsewhere classified: Secondary | ICD-10-CM | POA: Diagnosis not present

## 2017-12-07 DIAGNOSIS — M6281 Muscle weakness (generalized): Secondary | ICD-10-CM | POA: Diagnosis not present
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
## 2017-12-18 ENCOUNTER — Encounter (INDEPENDENT_AMBULATORY_CARE_PROVIDER_SITE_OTHER): Payer: Self-pay | Admitting: Orthopedic Surgery

## 2017-12-18 ENCOUNTER — Ambulatory Visit (INDEPENDENT_AMBULATORY_CARE_PROVIDER_SITE_OTHER): Payer: Medicare Other | Admitting: Orthopedic Surgery

## 2017-12-18 DIAGNOSIS — S83511A Sprain of anterior cruciate ligament of right knee, initial encounter: Secondary | ICD-10-CM
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
## 2017-12-19 ENCOUNTER — Telehealth (INDEPENDENT_AMBULATORY_CARE_PROVIDER_SITE_OTHER): Payer: Self-pay | Admitting: Orthopedic Surgery

## 2017-12-19 NOTE — Telephone Encounter (Signed)
Please advise 

## 2017-12-19 NOTE — Telephone Encounter (Signed)
Patient called advised his employer said as long as he is sitting down he can work. 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 The number to contact patient is is [phone]

## 2017-12-19 NOTE — Telephone Encounter (Signed)
Driving is ultimately his decision.  However he is fully weightbearing and can flex his knee past 90 degrees.  I am not sure was involved in picking up the toys and whether or not that involves carrying things but for sitdown work he should be fine but driving is his decision

## 2017-12-20 ENCOUNTER — Ambulatory Visit (HOSPITAL_COMMUNITY): Payer: Medicare Other | Attending: Orthopedic Surgery

## 2017-12-20 ENCOUNTER — Telehealth (INDEPENDENT_AMBULATORY_CARE_PROVIDER_SITE_OTHER): Payer: Self-pay | Admitting: Orthopedic Surgery

## 2017-12-20 ENCOUNTER — Encounter (HOSPITAL_COMMUNITY): Payer: Self-pay

## 2017-12-20 DIAGNOSIS — M25561 Pain in right knee: Secondary | ICD-10-CM | POA: Diagnosis not present

## 2017-12-20 DIAGNOSIS — M6281 Muscle weakness (generalized): Secondary | ICD-10-CM | POA: Diagnosis not present

## 2017-12-20 DIAGNOSIS — M25661 Stiffness of right knee, not elsewhere classified: Secondary | ICD-10-CM | POA: Diagnosis not present

## 2017-12-20 DIAGNOSIS — R262 Difficulty in walking, not elsewhere classified: Secondary | ICD-10-CM | POA: Diagnosis not present
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
## 2017-12-20 NOTE — Telephone Encounter (Signed)
LMOM of the below message  

## 2017-12-20 NOTE — Telephone Encounter (Signed)
FYI Received call from patient read message to him from Dr August Saucerean. Also advised patient a detailed message was left on his voicemail from [place] today concerning message from Dr August Saucerean.

## 2017-12-22 ENCOUNTER — Telehealth (HOSPITAL_COMMUNITY): Payer: Self-pay | Admitting: General Practice

## 2017-12-22 ENCOUNTER — Ambulatory Visit (HOSPITAL_COMMUNITY): Payer: Medicare Other

## 2017-12-22 NOTE — Telephone Encounter (Signed)
12/22/17  pt cx said he had a death in his family

## 2017-12-25 ENCOUNTER — Ambulatory Visit (HOSPITAL_COMMUNITY): Payer: Medicare Other

## 2017-12-25 DIAGNOSIS — R262 Difficulty in walking, not elsewhere classified: Secondary | ICD-10-CM | POA: Diagnosis not present

## 2017-12-25 DIAGNOSIS — M6281 Muscle weakness (generalized): Secondary | ICD-10-CM

## 2017-12-25 DIAGNOSIS — M25561 Pain in right knee: Secondary | ICD-10-CM

## 2017-12-25 DIAGNOSIS — M25661 Stiffness of right knee, not elsewhere classified: Secondary | ICD-10-CM
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
## 2017-12-27 ENCOUNTER — Encounter (HOSPITAL_COMMUNITY): Payer: Self-pay

## 2017-12-27 ENCOUNTER — Ambulatory Visit (HOSPITAL_COMMUNITY): Payer: Medicare Other

## 2017-12-27 DIAGNOSIS — M6281 Muscle weakness (generalized): Secondary | ICD-10-CM

## 2017-12-27 DIAGNOSIS — M25661 Stiffness of right knee, not elsewhere classified: Secondary | ICD-10-CM

## 2017-12-27 DIAGNOSIS — R262 Difficulty in walking, not elsewhere classified: Secondary | ICD-10-CM | POA: Diagnosis not present

## 2017-12-27 DIAGNOSIS — M25561 Pain in right knee: Secondary | ICD-10-CM | POA: Diagnosis not present
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
## 2017-12-29 ENCOUNTER — Encounter (HOSPITAL_COMMUNITY): Payer: Self-pay

## 2017-12-29 ENCOUNTER — Ambulatory Visit (HOSPITAL_COMMUNITY): Payer: Medicare Other

## 2017-12-29 DIAGNOSIS — M6281 Muscle weakness (generalized): Secondary | ICD-10-CM

## 2017-12-29 DIAGNOSIS — M25661 Stiffness of right knee, not elsewhere classified: Secondary | ICD-10-CM | POA: Diagnosis not present

## 2017-12-29 DIAGNOSIS — M25561 Pain in right knee: Secondary | ICD-10-CM

## 2017-12-29 DIAGNOSIS — R262 Difficulty in walking, not elsewhere classified: Secondary | ICD-10-CM | POA: Diagnosis not present
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
## 2018-01-01 ENCOUNTER — Encounter (HOSPITAL_COMMUNITY): Payer: Self-pay

## 2018-01-01 ENCOUNTER — Ambulatory Visit (HOSPITAL_COMMUNITY): Payer: Medicare Other

## 2018-01-01 DIAGNOSIS — M25561 Pain in right knee: Secondary | ICD-10-CM | POA: Diagnosis not present

## 2018-01-01 DIAGNOSIS — M6281 Muscle weakness (generalized): Secondary | ICD-10-CM

## 2018-01-01 DIAGNOSIS — R262 Difficulty in walking, not elsewhere classified: Secondary | ICD-10-CM

## 2018-01-01 DIAGNOSIS — M25661 Stiffness of right knee, not elsewhere classified: Secondary | ICD-10-CM
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
## 2018-01-03 ENCOUNTER — Encounter (HOSPITAL_COMMUNITY): Payer: Self-pay

## 2018-01-03 ENCOUNTER — Ambulatory Visit (HOSPITAL_COMMUNITY): Payer: Medicare Other

## 2018-01-03 DIAGNOSIS — R262 Difficulty in walking, not elsewhere classified: Secondary | ICD-10-CM | POA: Diagnosis not present

## 2018-01-03 DIAGNOSIS — M6281 Muscle weakness (generalized): Secondary | ICD-10-CM | POA: Diagnosis not present

## 2018-01-03 DIAGNOSIS — M25661 Stiffness of right knee, not elsewhere classified: Secondary | ICD-10-CM | POA: Diagnosis not present

## 2018-01-03 DIAGNOSIS — M25561 Pain in right knee: Secondary | ICD-10-CM
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
## 2018-01-05 ENCOUNTER — Encounter (HOSPITAL_COMMUNITY): Payer: Self-pay

## 2018-01-05 ENCOUNTER — Ambulatory Visit (HOSPITAL_COMMUNITY): Payer: Medicare Other

## 2018-01-05 DIAGNOSIS — M25661 Stiffness of right knee, not elsewhere classified: Secondary | ICD-10-CM | POA: Diagnosis not present

## 2018-01-05 DIAGNOSIS — M6281 Muscle weakness (generalized): Secondary | ICD-10-CM | POA: Diagnosis not present

## 2018-01-05 DIAGNOSIS — R262 Difficulty in walking, not elsewhere classified: Secondary | ICD-10-CM

## 2018-01-05 DIAGNOSIS — M25561 Pain in right knee: Secondary | ICD-10-CM
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
## 2018-01-08 ENCOUNTER — Ambulatory Visit (HOSPITAL_COMMUNITY): Payer: Medicare Other

## 2018-01-08 ENCOUNTER — Encounter (HOSPITAL_COMMUNITY): Payer: Self-pay

## 2018-01-08 DIAGNOSIS — M25561 Pain in right knee: Secondary | ICD-10-CM | POA: Diagnosis not present

## 2018-01-08 DIAGNOSIS — M6281 Muscle weakness (generalized): Secondary | ICD-10-CM

## 2018-01-08 DIAGNOSIS — R262 Difficulty in walking, not elsewhere classified: Secondary | ICD-10-CM | POA: Diagnosis not present

## 2018-01-08 DIAGNOSIS — M25661 Stiffness of right knee, not elsewhere classified: Secondary | ICD-10-CM | POA: Diagnosis not present
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
## 2018-01-11 ENCOUNTER — Encounter (HOSPITAL_COMMUNITY): Payer: Self-pay

## 2018-01-11 ENCOUNTER — Ambulatory Visit (HOSPITAL_COMMUNITY): Payer: Medicare Other

## 2018-01-11 DIAGNOSIS — M25561 Pain in right knee: Secondary | ICD-10-CM

## 2018-01-11 DIAGNOSIS — R262 Difficulty in walking, not elsewhere classified: Secondary | ICD-10-CM

## 2018-01-11 DIAGNOSIS — M25661 Stiffness of right knee, not elsewhere classified: Secondary | ICD-10-CM | POA: Diagnosis not present

## 2018-01-11 DIAGNOSIS — M6281 Muscle weakness (generalized): Secondary | ICD-10-CM | POA: Diagnosis not present
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
## 2018-01-15 ENCOUNTER — Encounter (HOSPITAL_COMMUNITY): Payer: Self-pay

## 2018-01-15 ENCOUNTER — Ambulatory Visit (HOSPITAL_COMMUNITY): Payer: Medicare Other

## 2018-01-15 DIAGNOSIS — R262 Difficulty in walking, not elsewhere classified: Secondary | ICD-10-CM

## 2018-01-15 DIAGNOSIS — M25561 Pain in right knee: Secondary | ICD-10-CM

## 2018-01-15 DIAGNOSIS — M6281 Muscle weakness (generalized): Secondary | ICD-10-CM

## 2018-01-15 DIAGNOSIS — M25661 Stiffness of right knee, not elsewhere classified: Secondary | ICD-10-CM | POA: Diagnosis not present
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
## 2018-01-18 ENCOUNTER — Other Ambulatory Visit: Payer: Self-pay

## 2018-01-18 ENCOUNTER — Encounter (HOSPITAL_COMMUNITY): Payer: Self-pay

## 2018-01-18 ENCOUNTER — Ambulatory Visit (HOSPITAL_COMMUNITY): Payer: Medicare Other | Attending: Orthopedic Surgery

## 2018-01-18 DIAGNOSIS — R262 Difficulty in walking, not elsewhere classified: Secondary | ICD-10-CM | POA: Diagnosis not present

## 2018-01-18 DIAGNOSIS — M25661 Stiffness of right knee, not elsewhere classified: Secondary | ICD-10-CM | POA: Insufficient documentation

## 2018-01-18 DIAGNOSIS — M25561 Pain in right knee: Secondary | ICD-10-CM | POA: Insufficient documentation

## 2018-01-18 DIAGNOSIS — M6281 Muscle weakness (generalized): Secondary | ICD-10-CM | POA: Diagnosis not present
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
## 2018-01-22 ENCOUNTER — Encounter (HOSPITAL_COMMUNITY): Payer: Self-pay

## 2018-01-22 ENCOUNTER — Ambulatory Visit (HOSPITAL_COMMUNITY): Payer: Medicare Other

## 2018-01-22 DIAGNOSIS — M6281 Muscle weakness (generalized): Secondary | ICD-10-CM | POA: Diagnosis not present

## 2018-01-22 DIAGNOSIS — M25661 Stiffness of right knee, not elsewhere classified: Secondary | ICD-10-CM | POA: Diagnosis not present

## 2018-01-22 DIAGNOSIS — R262 Difficulty in walking, not elsewhere classified: Secondary | ICD-10-CM | POA: Diagnosis not present

## 2018-01-22 DIAGNOSIS — M25561 Pain in right knee: Secondary | ICD-10-CM
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
## 2018-01-23 ENCOUNTER — Encounter (HOSPITAL_COMMUNITY): Payer: Self-pay

## 2018-01-25 ENCOUNTER — Ambulatory Visit (HOSPITAL_COMMUNITY): Payer: Medicare Other | Admitting: Physical Therapy

## 2018-01-25 DIAGNOSIS — M6281 Muscle weakness (generalized): Secondary | ICD-10-CM | POA: Diagnosis not present

## 2018-01-25 DIAGNOSIS — M25561 Pain in right knee: Secondary | ICD-10-CM

## 2018-01-25 DIAGNOSIS — R262 Difficulty in walking, not elsewhere classified: Secondary | ICD-10-CM | POA: Diagnosis not present

## 2018-01-25 DIAGNOSIS — M25661 Stiffness of right knee, not elsewhere classified: Secondary | ICD-10-CM
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
## 2018-01-29 ENCOUNTER — Encounter (INDEPENDENT_AMBULATORY_CARE_PROVIDER_SITE_OTHER): Payer: Self-pay | Admitting: Orthopedic Surgery

## 2018-01-29 ENCOUNTER — Ambulatory Visit (INDEPENDENT_AMBULATORY_CARE_PROVIDER_SITE_OTHER): Payer: Medicare Other | Admitting: Orthopedic Surgery

## 2018-01-29 VITALS — Ht 65.5 in | Wt 175.0 lb

## 2018-01-29 DIAGNOSIS — S83511A Sprain of anterior cruciate ligament of right knee, initial encounter: Secondary | ICD-10-CM
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
## 2018-01-30 ENCOUNTER — Ambulatory Visit (HOSPITAL_COMMUNITY): Payer: Medicare Other

## 2018-01-30 ENCOUNTER — Encounter (HOSPITAL_COMMUNITY): Payer: Self-pay

## 2018-01-30 DIAGNOSIS — M25561 Pain in right knee: Secondary | ICD-10-CM | POA: Diagnosis not present

## 2018-01-30 DIAGNOSIS — M25661 Stiffness of right knee, not elsewhere classified: Secondary | ICD-10-CM | POA: Diagnosis not present

## 2018-01-30 DIAGNOSIS — M6281 Muscle weakness (generalized): Secondary | ICD-10-CM | POA: Diagnosis not present

## 2018-01-30 DIAGNOSIS — R262 Difficulty in walking, not elsewhere classified: Secondary | ICD-10-CM | POA: Diagnosis not present

## 2018-01-30 NOTE — Patient Instructions (Signed)
Knee Extension: Quad Set (Eccentric), (Resistance Band)    Stand holding support, tubing behind fully extended knee. Slowly bend knee for 3-5 seconds. Use blue resistance band. 15 reps per set, 5" holds 2 sets per day, 4 days per week.  http://ecce.exer.us/132   Copyright  VHI. All rights reserved.
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
## 2018-02-01 ENCOUNTER — Ambulatory Visit (HOSPITAL_COMMUNITY): Payer: Medicare Other

## 2018-02-06 ENCOUNTER — Ambulatory Visit (HOSPITAL_COMMUNITY): Payer: Medicare Other | Admitting: Physical Therapy

## 2018-02-06 DIAGNOSIS — R262 Difficulty in walking, not elsewhere classified: Secondary | ICD-10-CM

## 2018-02-06 DIAGNOSIS — M6281 Muscle weakness (generalized): Secondary | ICD-10-CM

## 2018-02-06 DIAGNOSIS — M25661 Stiffness of right knee, not elsewhere classified: Secondary | ICD-10-CM | POA: Diagnosis not present

## 2018-02-06 DIAGNOSIS — M25561 Pain in right knee: Secondary | ICD-10-CM | POA: Diagnosis not present
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
## 2018-02-08 ENCOUNTER — Encounter (HOSPITAL_COMMUNITY): Payer: Self-pay

## 2018-02-08 ENCOUNTER — Ambulatory Visit (HOSPITAL_COMMUNITY): Payer: Medicare Other

## 2018-02-08 DIAGNOSIS — M6281 Muscle weakness (generalized): Secondary | ICD-10-CM

## 2018-02-08 DIAGNOSIS — M25561 Pain in right knee: Secondary | ICD-10-CM

## 2018-02-08 DIAGNOSIS — M25661 Stiffness of right knee, not elsewhere classified: Secondary | ICD-10-CM | POA: Diagnosis not present

## 2018-02-08 DIAGNOSIS — R262 Difficulty in walking, not elsewhere classified: Secondary | ICD-10-CM | POA: Diagnosis not present
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
## 2018-02-13 ENCOUNTER — Ambulatory Visit (HOSPITAL_COMMUNITY): Payer: Medicare Other

## 2018-02-13 ENCOUNTER — Encounter (HOSPITAL_COMMUNITY): Payer: Self-pay

## 2018-02-13 DIAGNOSIS — M25561 Pain in right knee: Secondary | ICD-10-CM | POA: Diagnosis not present

## 2018-02-13 DIAGNOSIS — M6281 Muscle weakness (generalized): Secondary | ICD-10-CM | POA: Diagnosis not present

## 2018-02-13 DIAGNOSIS — R262 Difficulty in walking, not elsewhere classified: Secondary | ICD-10-CM | POA: Diagnosis not present

## 2018-02-13 DIAGNOSIS — M25661 Stiffness of right knee, not elsewhere classified: Secondary | ICD-10-CM
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
## 2018-02-20 ENCOUNTER — Encounter (HOSPITAL_COMMUNITY): Payer: Self-pay

## 2018-02-20 ENCOUNTER — Ambulatory Visit (HOSPITAL_COMMUNITY): Payer: Medicare Other | Attending: Orthopedic Surgery

## 2018-02-20 DIAGNOSIS — M25661 Stiffness of right knee, not elsewhere classified: Secondary | ICD-10-CM | POA: Insufficient documentation

## 2018-02-20 DIAGNOSIS — R262 Difficulty in walking, not elsewhere classified: Secondary | ICD-10-CM | POA: Insufficient documentation

## 2018-02-20 DIAGNOSIS — M25561 Pain in right knee: Secondary | ICD-10-CM | POA: Insufficient documentation

## 2018-02-20 DIAGNOSIS — M6281 Muscle weakness (generalized): Secondary | ICD-10-CM | POA: Insufficient documentation
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
## 2018-02-22 ENCOUNTER — Ambulatory Visit (HOSPITAL_COMMUNITY): Payer: Medicare Other

## 2018-02-22 ENCOUNTER — Encounter (HOSPITAL_COMMUNITY): Payer: Self-pay

## 2018-02-22 DIAGNOSIS — M25561 Pain in right knee: Secondary | ICD-10-CM | POA: Diagnosis not present

## 2018-02-22 DIAGNOSIS — M25661 Stiffness of right knee, not elsewhere classified: Secondary | ICD-10-CM

## 2018-02-22 DIAGNOSIS — M6281 Muscle weakness (generalized): Secondary | ICD-10-CM | POA: Diagnosis not present

## 2018-02-22 DIAGNOSIS — R262 Difficulty in walking, not elsewhere classified: Secondary | ICD-10-CM
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
## 2018-02-27 ENCOUNTER — Telehealth (HOSPITAL_COMMUNITY): Payer: Self-pay | Admitting: General Practice

## 2018-02-27 ENCOUNTER — Ambulatory Visit (HOSPITAL_COMMUNITY): Payer: Medicare Other

## 2018-02-27 NOTE — Telephone Encounter (Signed)
02/27/18  left a messge that he has a back injury and can't get out of bed this morning

## 2018-03-01 ENCOUNTER — Encounter (HOSPITAL_COMMUNITY): Payer: Self-pay

## 2018-03-01 ENCOUNTER — Ambulatory Visit (HOSPITAL_COMMUNITY): Payer: Medicare Other

## 2018-03-01 DIAGNOSIS — M6281 Muscle weakness (generalized): Secondary | ICD-10-CM

## 2018-03-01 DIAGNOSIS — R262 Difficulty in walking, not elsewhere classified: Secondary | ICD-10-CM

## 2018-03-01 DIAGNOSIS — M25661 Stiffness of right knee, not elsewhere classified: Secondary | ICD-10-CM

## 2018-03-01 DIAGNOSIS — M25561 Pain in right knee: Secondary | ICD-10-CM
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
## 2018-03-06 ENCOUNTER — Ambulatory Visit (HOSPITAL_COMMUNITY): Payer: Medicare Other

## 2018-03-06 ENCOUNTER — Encounter (HOSPITAL_COMMUNITY): Payer: Self-pay

## 2018-03-06 DIAGNOSIS — M6281 Muscle weakness (generalized): Secondary | ICD-10-CM

## 2018-03-06 DIAGNOSIS — R262 Difficulty in walking, not elsewhere classified: Secondary | ICD-10-CM

## 2018-03-06 DIAGNOSIS — M25561 Pain in right knee: Secondary | ICD-10-CM | POA: Diagnosis not present

## 2018-03-06 DIAGNOSIS — M25661 Stiffness of right knee, not elsewhere classified: Secondary | ICD-10-CM
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
## 2018-03-08 ENCOUNTER — Encounter (HOSPITAL_COMMUNITY): Payer: Self-pay

## 2018-03-08 ENCOUNTER — Ambulatory Visit (HOSPITAL_COMMUNITY): Payer: Medicare Other

## 2018-03-08 DIAGNOSIS — M25561 Pain in right knee: Secondary | ICD-10-CM

## 2018-03-08 DIAGNOSIS — M25661 Stiffness of right knee, not elsewhere classified: Secondary | ICD-10-CM | POA: Diagnosis not present

## 2018-03-08 DIAGNOSIS — R262 Difficulty in walking, not elsewhere classified: Secondary | ICD-10-CM | POA: Diagnosis not present

## 2018-03-08 DIAGNOSIS — M6281 Muscle weakness (generalized): Secondary | ICD-10-CM

## 2018-03-08 NOTE — Patient Instructions (Signed)
Gastroc, Standing    Stand, right foot behind, heel on floor and turned slightly out, leg straight, forward leg bent. Keeping arms straight, push pelvis forward until stretch is felt in calf. Hold 30 seconds. Repeat 2 times per session. Do 1-2 sessions per day.  Copyright  VHI. All rights reserved.
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
## 2018-03-13 ENCOUNTER — Encounter (HOSPITAL_COMMUNITY): Payer: Self-pay

## 2018-03-13 ENCOUNTER — Ambulatory Visit (HOSPITAL_COMMUNITY): Payer: Medicare Other

## 2018-03-13 DIAGNOSIS — M25661 Stiffness of right knee, not elsewhere classified: Secondary | ICD-10-CM

## 2018-03-13 DIAGNOSIS — M25561 Pain in right knee: Secondary | ICD-10-CM | POA: Diagnosis not present

## 2018-03-13 DIAGNOSIS — M6281 Muscle weakness (generalized): Secondary | ICD-10-CM

## 2018-03-13 DIAGNOSIS — R262 Difficulty in walking, not elsewhere classified: Secondary | ICD-10-CM | POA: Diagnosis not present
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
## 2018-03-15 ENCOUNTER — Ambulatory Visit (HOSPITAL_COMMUNITY): Payer: Medicare Other

## 2018-03-15 ENCOUNTER — Encounter (HOSPITAL_COMMUNITY): Payer: Self-pay

## 2018-03-15 DIAGNOSIS — R262 Difficulty in walking, not elsewhere classified: Secondary | ICD-10-CM

## 2018-03-15 DIAGNOSIS — M25561 Pain in right knee: Secondary | ICD-10-CM

## 2018-03-15 DIAGNOSIS — M25661 Stiffness of right knee, not elsewhere classified: Secondary | ICD-10-CM

## 2018-03-15 DIAGNOSIS — M6281 Muscle weakness (generalized): Secondary | ICD-10-CM | POA: Diagnosis not present
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
## 2018-03-20 ENCOUNTER — Ambulatory Visit (HOSPITAL_COMMUNITY): Payer: Medicare Other

## 2018-03-22 ENCOUNTER — Encounter (HOSPITAL_COMMUNITY): Payer: Self-pay

## 2018-03-26 ENCOUNTER — Ambulatory Visit (INDEPENDENT_AMBULATORY_CARE_PROVIDER_SITE_OTHER): Payer: Medicare Other | Admitting: Orthopedic Surgery

## 2018-04-02 ENCOUNTER — Other Ambulatory Visit: Payer: Self-pay

## 2018-04-02 ENCOUNTER — Ambulatory Visit (INDEPENDENT_AMBULATORY_CARE_PROVIDER_SITE_OTHER): Payer: Medicare Other | Admitting: Orthopedic Surgery

## 2018-04-02 ENCOUNTER — Encounter (INDEPENDENT_AMBULATORY_CARE_PROVIDER_SITE_OTHER): Payer: Self-pay | Admitting: Orthopedic Surgery

## 2018-04-02 DIAGNOSIS — Z720 Tobacco use: Secondary | ICD-10-CM | POA: Diagnosis not present

## 2018-04-02 DIAGNOSIS — S83511A Sprain of anterior cruciate ligament of right knee, initial encounter: Secondary | ICD-10-CM | POA: Diagnosis not present

## 2018-04-04 ENCOUNTER — Encounter (INDEPENDENT_AMBULATORY_CARE_PROVIDER_SITE_OTHER): Payer: Self-pay | Admitting: Orthopedic Surgery
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
## 2018-05-15 ENCOUNTER — Telehealth (INDEPENDENT_AMBULATORY_CARE_PROVIDER_SITE_OTHER): Payer: Self-pay | Admitting: Orthopedic Surgery

## 2018-05-15 NOTE — Telephone Encounter (Signed)
Ok for tramadol 1 po q d # 25 then otc thx

## 2018-05-15 NOTE — Telephone Encounter (Signed)
Please advise. Thanks.  

## 2018-05-15 NOTE — Telephone Encounter (Signed)
Patient calling requesting pain medication for right knee.  He states that he feels like something is "crawling on the inside of right leg, in the calf area".  He states he doesn't have anyone else to call because he does not have a family doctor. Please advise for pain management.   [facility]   pts cb  [phone]

## 2018-05-16 MED ORDER — TRAMADOL HCL 50 MG PO TABS: ORAL_TABLET | ORAL | 0 refills | Status: DC

## 2018-05-16 NOTE — Telephone Encounter (Signed)
Called to pharmacy 

## 2018-06-21 DIAGNOSIS — M25561 Pain in right knee: Secondary | ICD-10-CM | POA: Diagnosis not present

## 2018-06-21 DIAGNOSIS — F319 Bipolar disorder, unspecified: Secondary | ICD-10-CM | POA: Diagnosis not present

## 2018-06-21 DIAGNOSIS — Z880 Allergy status to penicillin: Secondary | ICD-10-CM | POA: Diagnosis not present

## 2018-06-21 DIAGNOSIS — G8929 Other chronic pain: Secondary | ICD-10-CM | POA: Diagnosis not present

## 2018-06-21 DIAGNOSIS — M25562 Pain in left knee: Secondary | ICD-10-CM | POA: Diagnosis not present

## 2018-06-21 DIAGNOSIS — F17200 Nicotine dependence, unspecified, uncomplicated: Secondary | ICD-10-CM | POA: Diagnosis not present

## 2018-06-28 ENCOUNTER — Other Ambulatory Visit: Payer: Self-pay

## 2018-06-28 ENCOUNTER — Ambulatory Visit (INDEPENDENT_AMBULATORY_CARE_PROVIDER_SITE_OTHER): Payer: Medicare Other | Admitting: Orthopedic Surgery

## 2018-06-28 ENCOUNTER — Ambulatory Visit (INDEPENDENT_AMBULATORY_CARE_PROVIDER_SITE_OTHER): Payer: Medicare Other

## 2018-06-28 ENCOUNTER — Encounter: Payer: Self-pay | Admitting: Orthopedic Surgery

## 2018-06-28 DIAGNOSIS — M25462 Effusion, left knee: Secondary | ICD-10-CM | POA: Diagnosis not present

## 2018-06-28 DIAGNOSIS — M25562 Pain in left knee: Secondary | ICD-10-CM

## 2018-06-28 MED ORDER — BUPIVACAINE HCL 0.25 % IJ SOLN
4.0000 mL | INTRAMUSCULAR | Status: AC | PRN
  Administered 2018-06-28: 4 mL via INTRA_ARTICULAR

## 2018-06-28 MED ORDER — METHYLPREDNISOLONE ACETATE 40 MG/ML IJ SUSP
40.0000 mg | INTRAMUSCULAR | Status: AC | PRN
  Administered 2018-06-28: 40 mg via INTRA_ARTICULAR

## 2018-06-28 MED ORDER — LIDOCAINE HCL 1 % IJ SOLN
5.0000 mL | INTRAMUSCULAR | Status: AC | PRN
  Administered 2018-06-28: 5 mL
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
## 2018-07-11 DIAGNOSIS — E559 Vitamin D deficiency, unspecified: Secondary | ICD-10-CM | POA: Diagnosis not present

## 2018-07-11 DIAGNOSIS — R5383 Other fatigue: Secondary | ICD-10-CM | POA: Diagnosis not present

## 2018-07-11 DIAGNOSIS — Z79899 Other long term (current) drug therapy: Secondary | ICD-10-CM | POA: Diagnosis not present

## 2018-07-11 DIAGNOSIS — M179 Osteoarthritis of knee, unspecified: Secondary | ICD-10-CM | POA: Diagnosis not present

## 2018-07-27 ENCOUNTER — Ambulatory Visit: Payer: Medicare Other | Admitting: Orthopedic Surgery

## 2018-08-13 ENCOUNTER — Telehealth: Payer: Self-pay | Admitting: Orthopedic Surgery

## 2018-08-13 NOTE — Telephone Encounter (Signed)
Patient called and requested a different knee brace that  Was given to him after surgery. One he has only have 2 strips on it. The steele plates have come through the wrap.  Please cal patient at [phone]

## 2018-08-14 NOTE — Telephone Encounter (Signed)
Please advise what brace you want him to have. Thanks.

## 2018-08-14 NOTE — Telephone Encounter (Signed)
I s/w patient and he would like to know if there is anything else? He states he as already gone through 2 or 3 of these. Please advise. Thanks.

## 2018-08-14 NOTE — Telephone Encounter (Signed)
Ok for hinged knee brace off the shelf thx

## 2018-08-15 NOTE — Telephone Encounter (Signed)
We can write him a prescription to get a biotech and get a different one but he might have to pay more.  If we are giving him the braces this is the only thing we have the offer.  If his call.

## 2018-08-16 NOTE — Telephone Encounter (Signed)
Order written for [employer]  Patient will come by this morning or this afternoon to pick up.

## 2018-09-05 DIAGNOSIS — H6122 Impacted cerumen, left ear: Secondary | ICD-10-CM | POA: Diagnosis not present

## 2018-09-05 DIAGNOSIS — M25512 Pain in left shoulder: Secondary | ICD-10-CM | POA: Diagnosis not present

## 2018-09-05 DIAGNOSIS — F17200 Nicotine dependence, unspecified, uncomplicated: Secondary | ICD-10-CM | POA: Diagnosis not present

## 2018-09-05 DIAGNOSIS — K0889 Other specified disorders of teeth and supporting structures: Secondary | ICD-10-CM | POA: Diagnosis not present

## 2018-09-05 DIAGNOSIS — S43402A Unspecified sprain of left shoulder joint, initial encounter: Secondary | ICD-10-CM | POA: Diagnosis not present

## 2018-09-05 DIAGNOSIS — G8918 Other acute postprocedural pain: Secondary | ICD-10-CM | POA: Diagnosis not present

## 2018-09-27 DIAGNOSIS — Z23 Encounter for immunization: Secondary | ICD-10-CM | POA: Diagnosis not present

## 2018-09-29 IMAGING — CT CT RENAL STONE PROTOCOL
2 of 3 series · 16 of 46 positions shown, 18 images · non-contrast
Comparison: MRI of the lumbar spine performed 06/15/2016, and CT of
the abdomen and pelvis performed 05/14/2008

CLINICAL DATA: Acute onset of left lower quadrant abdominal pain.

EXAM:
CT ABDOMEN AND PELVIS WITHOUT CONTRAST
TECHNIQUE: Multidetector CT imaging of the abdomen and pelvis was performed
following the standard protocol without IV contrast.

[Series 2: axial st · axial · 0.80mm/px · z∈[-328,+72]mm · 13 of 92 slices shown, 15 images]
[im 6/92  soft-tissue]
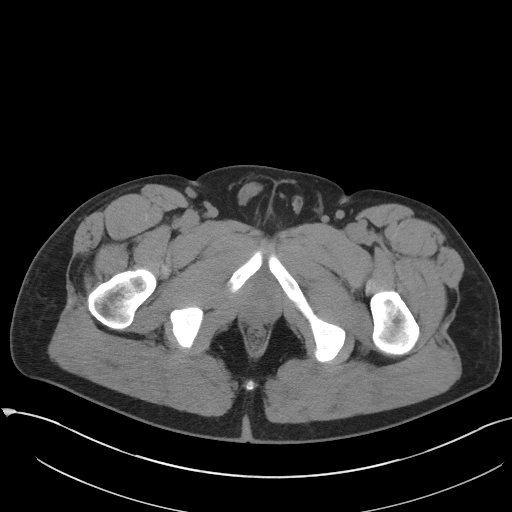
[im 6/92  bone]
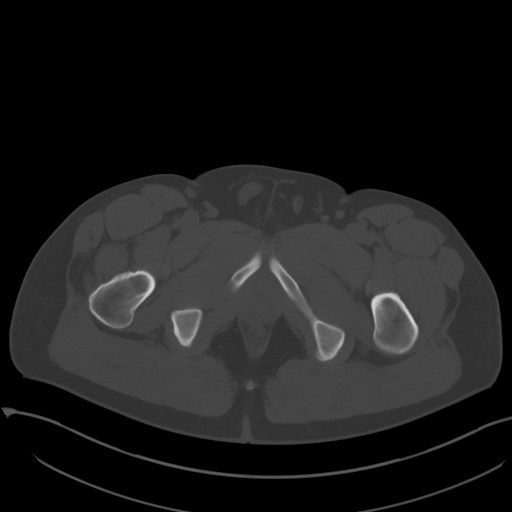
[im 12/92  soft-tissue]
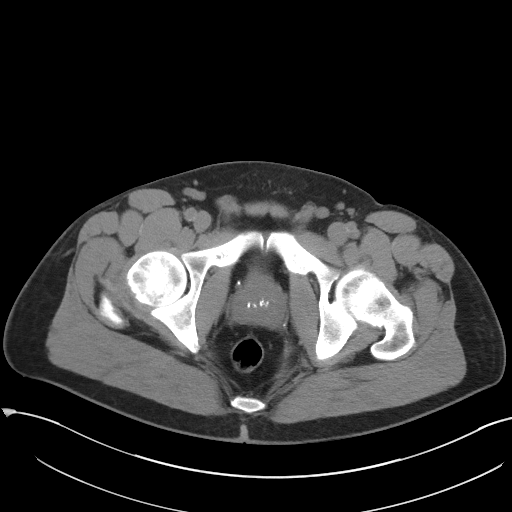
[im 18/92  soft-tissue]
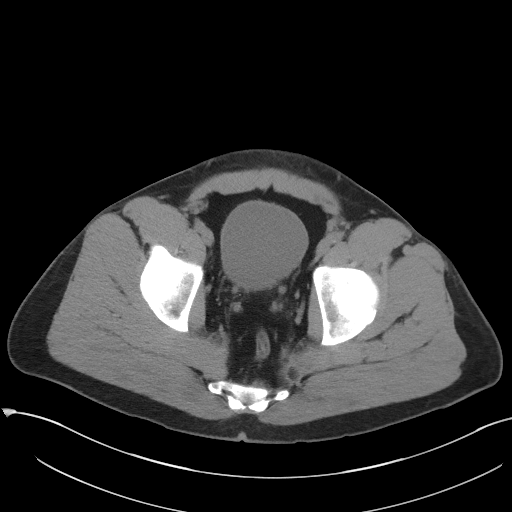
[im 27/92  soft-tissue]
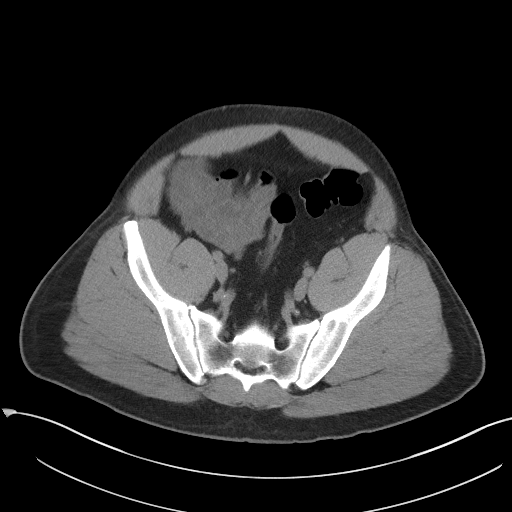
[im 33/92  soft-tissue]
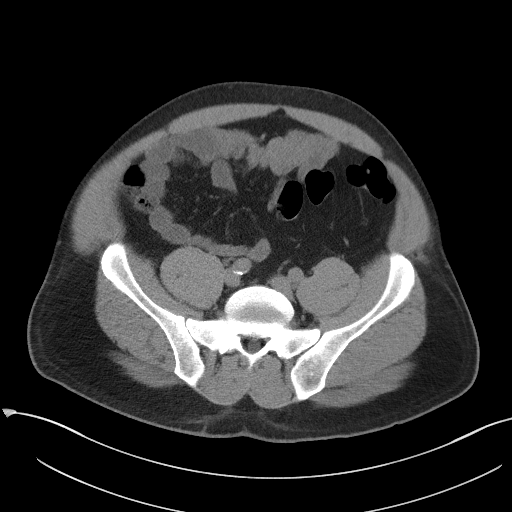
[im 39/92  soft-tissue]
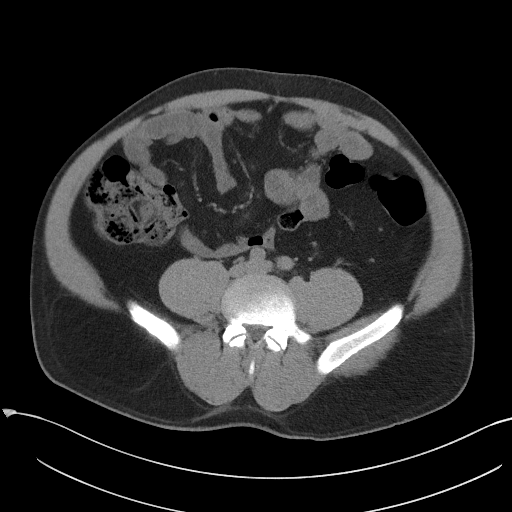
[im 47/92  soft-tissue]
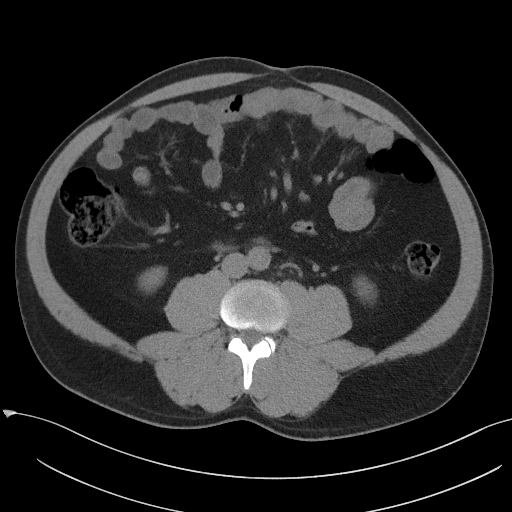
[im 53/92  soft-tissue]
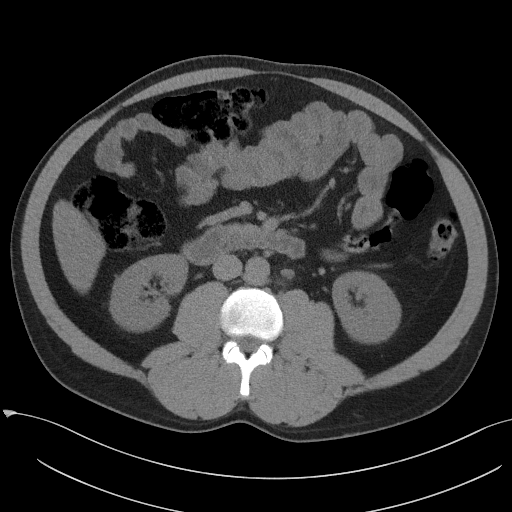
[im 59/92  soft-tissue]
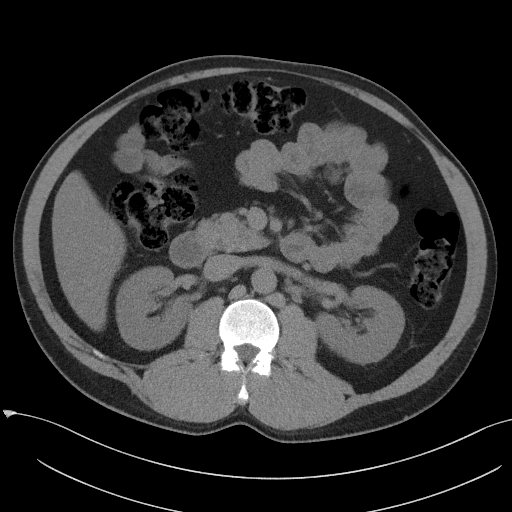
[im 59/92  bone]
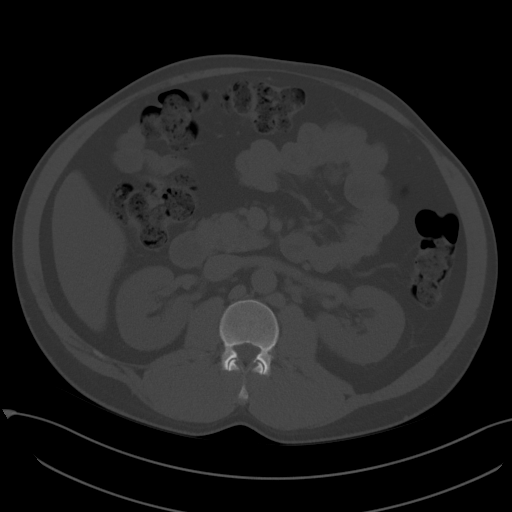
[im 65/92  soft-tissue]
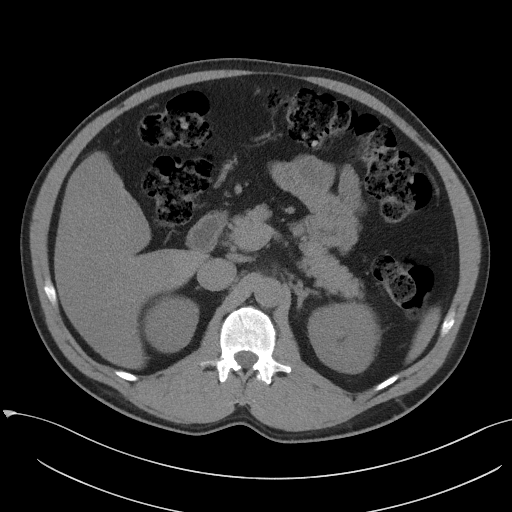
[im 74/92  soft-tissue]
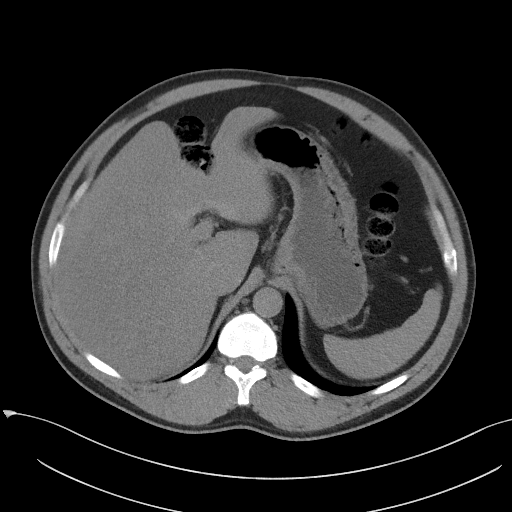
[im 80/92  soft-tissue]
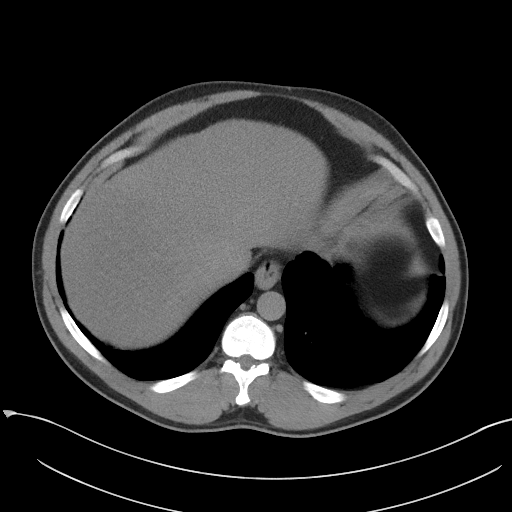
[im 86/92  soft-tissue]
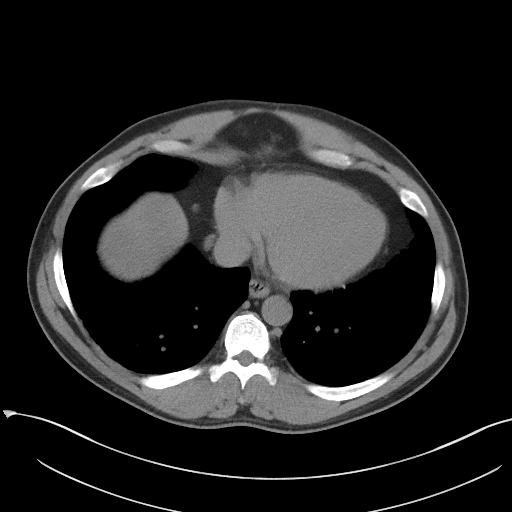

[Series 5: coronal st · coronal · 0.77mm/px · 3 of 101 slices shown]
[im 34/101  soft-tissue]
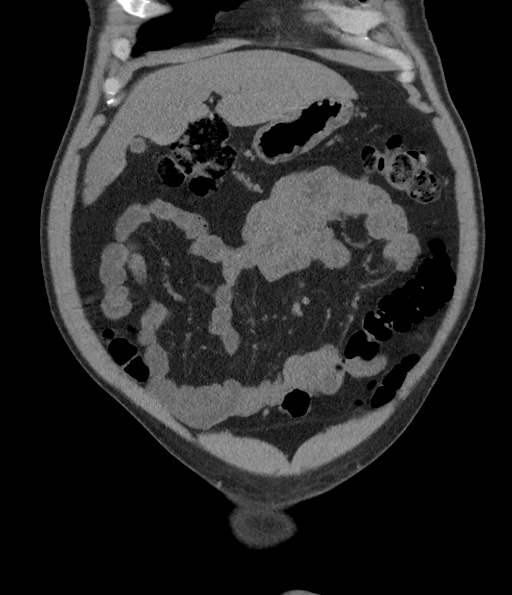
[im 45/101  soft-tissue]
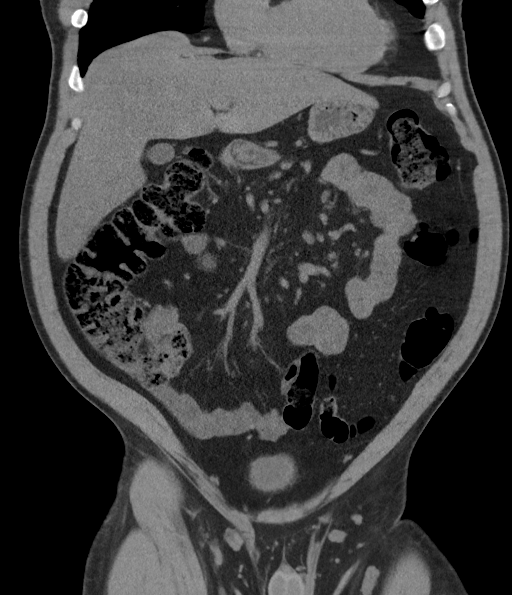
[im 56/101  soft-tissue]
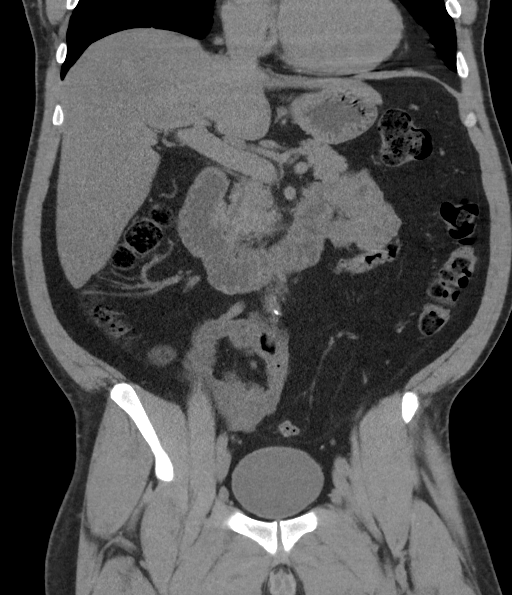

[16 of 46 positions shown; findings below may reference images not displayed]

FINDINGS: Lower chest: Mild right basilar atelectasis is noted. The visualized
portions of the mediastinum are unremarkable.

Hepatobiliary: Mild fatty infiltration is noted within the liver.
The liver is otherwise unremarkable. The gallbladder is unremarkable
in appearance. The common bile duct is normal in caliber.

Pancreas: The pancreas is within normal limits.

Spleen: The spleen is unremarkable in appearance.

Adrenals/Urinary Tract: The adrenal glands are unremarkable in
appearance. The kidneys are within normal limits. There is no
evidence of hydronephrosis. No renal or ureteral stones are
identified. No perinephric stranding is seen.

Stomach/Bowel: The stomach is unremarkable in appearance. The small
bowel is within normal limits. The appendix is normal in caliber,
without evidence of appendicitis. The colon is unremarkable in
appearance.

Vascular/Lymphatic: The abdominal aorta is unremarkable in
appearance. Minimal calcification is noted at the aortic
bifurcation. The inferior vena cava is grossly unremarkable. No
retroperitoneal lymphadenopathy is seen. No pelvic sidewall
lymphadenopathy is identified.

Reproductive: The bladder is mildly distended and grossly
unremarkable. The prostate is borderline normal in size, with
scattered calcification.

Other: No additional soft tissue abnormalities are seen.

Musculoskeletal: No acute osseous abnormalities are identified. The
visualized musculature is unremarkable in appearance.
IMPRESSION: 1. No acute abnormality seen within the abdomen or pelvis.
2. Mild fatty infiltration within the liver.
3. Mild right basilar atelectasis noted.

## 2018-10-17 DIAGNOSIS — E782 Mixed hyperlipidemia: Secondary | ICD-10-CM | POA: Diagnosis not present

## 2018-10-17 DIAGNOSIS — I10 Essential (primary) hypertension: Secondary | ICD-10-CM | POA: Diagnosis not present

## 2018-10-17 DIAGNOSIS — M79641 Pain in right hand: Secondary | ICD-10-CM | POA: Diagnosis not present

## 2019-01-29 DIAGNOSIS — I209 Angina pectoris, unspecified: Secondary | ICD-10-CM | POA: Diagnosis not present

## 2019-01-29 DIAGNOSIS — I208 Other forms of angina pectoris: Secondary | ICD-10-CM | POA: Diagnosis not present

## 2019-02-05 DIAGNOSIS — R0789 Other chest pain: Secondary | ICD-10-CM | POA: Diagnosis not present

## 2019-04-17 DIAGNOSIS — Z0000 Encounter for general adult medical examination without abnormal findings: Secondary | ICD-10-CM | POA: Diagnosis not present

## 2019-04-17 DIAGNOSIS — I209 Angina pectoris, unspecified: Secondary | ICD-10-CM | POA: Diagnosis not present

## 2019-04-17 DIAGNOSIS — R7303 Prediabetes: Secondary | ICD-10-CM | POA: Diagnosis not present

## 2019-04-17 DIAGNOSIS — E782 Mixed hyperlipidemia: Secondary | ICD-10-CM | POA: Diagnosis not present

## 2019-04-17 DIAGNOSIS — I10 Essential (primary) hypertension: Secondary | ICD-10-CM | POA: Diagnosis not present

## 2019-04-17 DIAGNOSIS — Z1331 Encounter for screening for depression: Secondary | ICD-10-CM | POA: Diagnosis not present

## 2019-06-24 DIAGNOSIS — E7800 Pure hypercholesterolemia, unspecified: Secondary | ICD-10-CM | POA: Diagnosis not present

## 2019-06-24 DIAGNOSIS — W57XXXA Bitten or stung by nonvenomous insect and other nonvenomous arthropods, initial encounter: Secondary | ICD-10-CM | POA: Diagnosis not present

## 2019-06-24 DIAGNOSIS — T148XXA Other injury of unspecified body region, initial encounter: Secondary | ICD-10-CM | POA: Diagnosis not present

## 2019-06-24 DIAGNOSIS — R21 Rash and other nonspecific skin eruption: Secondary | ICD-10-CM | POA: Diagnosis not present

## 2019-06-24 DIAGNOSIS — I10 Essential (primary) hypertension: Secondary | ICD-10-CM | POA: Diagnosis not present

## 2019-06-24 DIAGNOSIS — F17210 Nicotine dependence, cigarettes, uncomplicated: Secondary | ICD-10-CM | POA: Diagnosis not present

## 2019-06-24 DIAGNOSIS — I2510 Atherosclerotic heart disease of native coronary artery without angina pectoris: Secondary | ICD-10-CM | POA: Diagnosis not present

## 2019-08-06 DIAGNOSIS — F330 Major depressive disorder, recurrent, mild: Secondary | ICD-10-CM | POA: Diagnosis not present

## 2019-08-06 DIAGNOSIS — A5903 Trichomonal cystitis and urethritis: Secondary | ICD-10-CM | POA: Diagnosis not present

## 2019-08-06 DIAGNOSIS — E782 Mixed hyperlipidemia: Secondary | ICD-10-CM | POA: Diagnosis not present

## 2019-08-06 DIAGNOSIS — I10 Essential (primary) hypertension: Secondary | ICD-10-CM | POA: Diagnosis not present

## 2019-09-25 DIAGNOSIS — E782 Mixed hyperlipidemia: Secondary | ICD-10-CM | POA: Diagnosis not present

## 2019-09-25 DIAGNOSIS — F330 Major depressive disorder, recurrent, mild: Secondary | ICD-10-CM | POA: Diagnosis not present

## 2019-09-25 DIAGNOSIS — I10 Essential (primary) hypertension: Secondary | ICD-10-CM | POA: Diagnosis not present

## 2019-09-25 DIAGNOSIS — M545 Low back pain: Secondary | ICD-10-CM | POA: Diagnosis not present

## 2019-09-30 DIAGNOSIS — R29898 Other symptoms and signs involving the musculoskeletal system: Secondary | ICD-10-CM | POA: Diagnosis not present

## 2019-09-30 DIAGNOSIS — Z6834 Body mass index (BMI) 34.0-34.9, adult: Secondary | ICD-10-CM | POA: Diagnosis not present

## 2019-11-10 DIAGNOSIS — I10 Essential (primary) hypertension: Secondary | ICD-10-CM | POA: Diagnosis not present

## 2019-11-10 DIAGNOSIS — Z880 Allergy status to penicillin: Secondary | ICD-10-CM | POA: Diagnosis not present

## 2019-11-10 DIAGNOSIS — I2510 Atherosclerotic heart disease of native coronary artery without angina pectoris: Secondary | ICD-10-CM | POA: Diagnosis not present

## 2019-11-10 DIAGNOSIS — R079 Chest pain, unspecified: Secondary | ICD-10-CM | POA: Diagnosis not present

## 2019-11-10 DIAGNOSIS — Z20822 Contact with and (suspected) exposure to covid-19: Secondary | ICD-10-CM | POA: Diagnosis not present

## 2019-11-10 DIAGNOSIS — Z7982 Long term (current) use of aspirin: Secondary | ICD-10-CM | POA: Diagnosis not present

## 2019-11-10 DIAGNOSIS — Z79899 Other long term (current) drug therapy: Secondary | ICD-10-CM | POA: Diagnosis not present

## 2019-11-10 DIAGNOSIS — R61 Generalized hyperhidrosis: Secondary | ICD-10-CM | POA: Diagnosis not present

## 2019-11-10 DIAGNOSIS — F17210 Nicotine dependence, cigarettes, uncomplicated: Secondary | ICD-10-CM | POA: Diagnosis not present

## 2019-11-29 DIAGNOSIS — R7303 Prediabetes: Secondary | ICD-10-CM | POA: Diagnosis not present

## 2019-11-29 DIAGNOSIS — E7800 Pure hypercholesterolemia, unspecified: Secondary | ICD-10-CM | POA: Diagnosis not present

## 2019-11-29 DIAGNOSIS — F419 Anxiety disorder, unspecified: Secondary | ICD-10-CM | POA: Diagnosis not present

## 2019-11-29 DIAGNOSIS — E782 Mixed hyperlipidemia: Secondary | ICD-10-CM | POA: Diagnosis not present

## 2019-11-29 DIAGNOSIS — Z23 Encounter for immunization: Secondary | ICD-10-CM | POA: Diagnosis not present

## 2019-11-29 DIAGNOSIS — I10 Essential (primary) hypertension: Secondary | ICD-10-CM | POA: Diagnosis not present

## 2019-11-29 DIAGNOSIS — Z6831 Body mass index (BMI) 31.0-31.9, adult: Secondary | ICD-10-CM | POA: Diagnosis not present

## 2019-11-29 DIAGNOSIS — E559 Vitamin D deficiency, unspecified: Secondary | ICD-10-CM | POA: Diagnosis not present

## 2019-11-29 DIAGNOSIS — F17200 Nicotine dependence, unspecified, uncomplicated: Secondary | ICD-10-CM | POA: Diagnosis not present

## 2019-12-13 DIAGNOSIS — Z6833 Body mass index (BMI) 33.0-33.9, adult: Secondary | ICD-10-CM | POA: Diagnosis not present

## 2019-12-13 DIAGNOSIS — I10 Essential (primary) hypertension: Secondary | ICD-10-CM | POA: Diagnosis not present

## 2019-12-13 DIAGNOSIS — Z1389 Encounter for screening for other disorder: Secondary | ICD-10-CM | POA: Diagnosis not present

## 2019-12-13 DIAGNOSIS — E7800 Pure hypercholesterolemia, unspecified: Secondary | ICD-10-CM | POA: Diagnosis not present

## 2019-12-13 DIAGNOSIS — F17200 Nicotine dependence, unspecified, uncomplicated: Secondary | ICD-10-CM | POA: Diagnosis not present

## 2019-12-13 DIAGNOSIS — Z1331 Encounter for screening for depression: Secondary | ICD-10-CM | POA: Diagnosis not present

## 2019-12-13 DIAGNOSIS — F419 Anxiety disorder, unspecified: Secondary | ICD-10-CM | POA: Diagnosis not present

## 2019-12-27 DIAGNOSIS — E7800 Pure hypercholesterolemia, unspecified: Secondary | ICD-10-CM | POA: Diagnosis not present

## 2019-12-27 DIAGNOSIS — Z6833 Body mass index (BMI) 33.0-33.9, adult: Secondary | ICD-10-CM | POA: Diagnosis not present

## 2019-12-27 DIAGNOSIS — I10 Essential (primary) hypertension: Secondary | ICD-10-CM | POA: Diagnosis not present

## 2019-12-27 DIAGNOSIS — R7303 Prediabetes: Secondary | ICD-10-CM | POA: Diagnosis not present

## 2019-12-27 DIAGNOSIS — F17200 Nicotine dependence, unspecified, uncomplicated: Secondary | ICD-10-CM | POA: Diagnosis not present

## 2019-12-27 DIAGNOSIS — F419 Anxiety disorder, unspecified: Secondary | ICD-10-CM | POA: Diagnosis not present

## 2020-01-09 DIAGNOSIS — R7303 Prediabetes: Secondary | ICD-10-CM | POA: Diagnosis not present

## 2020-01-09 DIAGNOSIS — M25569 Pain in unspecified knee: Secondary | ICD-10-CM | POA: Diagnosis not present

## 2020-01-09 DIAGNOSIS — F17200 Nicotine dependence, unspecified, uncomplicated: Secondary | ICD-10-CM | POA: Diagnosis not present

## 2020-01-09 DIAGNOSIS — Z6833 Body mass index (BMI) 33.0-33.9, adult: Secondary | ICD-10-CM | POA: Diagnosis not present

## 2020-01-09 DIAGNOSIS — I10 Essential (primary) hypertension: Secondary | ICD-10-CM | POA: Diagnosis not present

## 2020-01-09 DIAGNOSIS — E7800 Pure hypercholesterolemia, unspecified: Secondary | ICD-10-CM | POA: Diagnosis not present

## 2020-01-09 DIAGNOSIS — F419 Anxiety disorder, unspecified: Secondary | ICD-10-CM | POA: Diagnosis not present

## 2020-04-27 ENCOUNTER — Ambulatory Visit (INDEPENDENT_AMBULATORY_CARE_PROVIDER_SITE_OTHER): Payer: Medicare Other

## 2020-04-27 ENCOUNTER — Ambulatory Visit (INDEPENDENT_AMBULATORY_CARE_PROVIDER_SITE_OTHER): Payer: Medicare Other | Admitting: Orthopedic Surgery

## 2020-04-27 ENCOUNTER — Other Ambulatory Visit: Payer: Self-pay

## 2020-04-27 ENCOUNTER — Ambulatory Visit: Payer: Self-pay

## 2020-04-27 DIAGNOSIS — M25562 Pain in left knee: Secondary | ICD-10-CM | POA: Diagnosis not present

## 2020-04-27 DIAGNOSIS — M1711 Unilateral primary osteoarthritis, right knee: Secondary | ICD-10-CM

## 2020-04-27 DIAGNOSIS — M25561 Pain in right knee: Secondary | ICD-10-CM | POA: Diagnosis not present

## 2020-04-27 DIAGNOSIS — Z9889 Other specified postprocedural states: Secondary | ICD-10-CM | POA: Diagnosis not present
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
## 2020-04-30 ENCOUNTER — Encounter: Payer: Self-pay | Admitting: Orthopedic Surgery

## 2020-07-06 DIAGNOSIS — I10 Essential (primary) hypertension: Secondary | ICD-10-CM | POA: Diagnosis not present

## 2020-07-06 DIAGNOSIS — Z0001 Encounter for general adult medical examination with abnormal findings: Secondary | ICD-10-CM | POA: Diagnosis not present

## 2020-07-06 DIAGNOSIS — F419 Anxiety disorder, unspecified: Secondary | ICD-10-CM | POA: Diagnosis not present

## 2020-07-06 DIAGNOSIS — E7801 Familial hypercholesterolemia: Secondary | ICD-10-CM | POA: Diagnosis not present

## 2020-07-06 DIAGNOSIS — Z6833 Body mass index (BMI) 33.0-33.9, adult: Secondary | ICD-10-CM | POA: Diagnosis not present

## 2020-07-06 DIAGNOSIS — F17200 Nicotine dependence, unspecified, uncomplicated: Secondary | ICD-10-CM | POA: Diagnosis not present

## 2020-07-15 DIAGNOSIS — R7303 Prediabetes: Secondary | ICD-10-CM | POA: Diagnosis not present

## 2020-07-15 DIAGNOSIS — E7800 Pure hypercholesterolemia, unspecified: Secondary | ICD-10-CM | POA: Diagnosis not present

## 2020-07-15 DIAGNOSIS — F419 Anxiety disorder, unspecified: Secondary | ICD-10-CM | POA: Diagnosis not present

## 2020-07-15 DIAGNOSIS — I10 Essential (primary) hypertension: Secondary | ICD-10-CM | POA: Diagnosis not present

## 2020-07-15 DIAGNOSIS — Z6832 Body mass index (BMI) 32.0-32.9, adult: Secondary | ICD-10-CM | POA: Diagnosis not present

## 2020-07-15 DIAGNOSIS — F17200 Nicotine dependence, unspecified, uncomplicated: Secondary | ICD-10-CM | POA: Diagnosis not present

## 2020-07-15 DIAGNOSIS — Z6833 Body mass index (BMI) 33.0-33.9, adult: Secondary | ICD-10-CM | POA: Diagnosis not present

## 2020-07-15 DIAGNOSIS — M25569 Pain in unspecified knee: Secondary | ICD-10-CM | POA: Diagnosis not present

## 2020-07-15 DIAGNOSIS — E7801 Familial hypercholesterolemia: Secondary | ICD-10-CM | POA: Diagnosis not present

## 2020-07-23 DIAGNOSIS — H524 Presbyopia: Secondary | ICD-10-CM | POA: Diagnosis not present

## 2020-07-23 DIAGNOSIS — E119 Type 2 diabetes mellitus without complications: Secondary | ICD-10-CM | POA: Diagnosis not present

## 2020-08-19 ENCOUNTER — Other Ambulatory Visit: Payer: Self-pay

## 2020-08-19 ENCOUNTER — Emergency Department (HOSPITAL_COMMUNITY)
Admission: EM | Admit: 2020-08-19 | Discharge: 2020-08-19 | Disposition: A | Discharge status: Home / Self Care | Payer: Medicare Other | Attending: Emergency Medicine | Admitting: Emergency Medicine

## 2020-08-19 ENCOUNTER — Encounter (HOSPITAL_COMMUNITY): Payer: Self-pay | Admitting: Emergency Medicine

## 2020-08-19 DIAGNOSIS — Z5321 Procedure and treatment not carried out due to patient leaving prior to being seen by health care provider: Secondary | ICD-10-CM | POA: Diagnosis not present

## 2020-08-19 DIAGNOSIS — L298 Other pruritus: Secondary | ICD-10-CM | POA: Diagnosis not present

## 2020-08-19 DIAGNOSIS — L255 Unspecified contact dermatitis due to plants, except food: Secondary | ICD-10-CM | POA: Diagnosis not present

## 2020-08-19 NOTE — ED Triage Notes (Signed)
Pt here for c/o itching to body and eyes x 2 days. 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

## 2020-09-28 DIAGNOSIS — I2510 Atherosclerotic heart disease of native coronary artery without angina pectoris: Secondary | ICD-10-CM | POA: Diagnosis not present

## 2020-09-28 DIAGNOSIS — E7800 Pure hypercholesterolemia, unspecified: Secondary | ICD-10-CM | POA: Diagnosis not present

## 2020-09-28 DIAGNOSIS — M542 Cervicalgia: Secondary | ICD-10-CM | POA: Diagnosis not present

## 2020-09-28 DIAGNOSIS — F319 Bipolar disorder, unspecified: Secondary | ICD-10-CM | POA: Diagnosis not present

## 2020-09-28 DIAGNOSIS — I10 Essential (primary) hypertension: Secondary | ICD-10-CM | POA: Diagnosis not present

## 2020-09-28 DIAGNOSIS — R519 Headache, unspecified: Secondary | ICD-10-CM | POA: Diagnosis not present

## 2020-09-28 DIAGNOSIS — F17210 Nicotine dependence, cigarettes, uncomplicated: Secondary | ICD-10-CM | POA: Diagnosis not present

## 2021-01-19 ENCOUNTER — Ambulatory Visit (INDEPENDENT_AMBULATORY_CARE_PROVIDER_SITE_OTHER): Payer: Medicare Other

## 2021-01-19 ENCOUNTER — Ambulatory Visit (INDEPENDENT_AMBULATORY_CARE_PROVIDER_SITE_OTHER): Payer: Medicare Other | Admitting: Orthopedic Surgery

## 2021-01-19 ENCOUNTER — Other Ambulatory Visit: Payer: Self-pay

## 2021-01-19 DIAGNOSIS — M25561 Pain in right knee: Secondary | ICD-10-CM | POA: Diagnosis not present

## 2021-01-20 ENCOUNTER — Encounter: Payer: Self-pay | Admitting: Orthopedic Surgery
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
## 2021-04-05 ENCOUNTER — Encounter (HOSPITAL_COMMUNITY): Payer: Self-pay | Admitting: *Deleted

## 2021-04-05 ENCOUNTER — Emergency Department (HOSPITAL_COMMUNITY)
Admission: EM | Admit: 2021-04-05 | Discharge: 2021-04-05 | Disposition: A | Discharge status: Home / Self Care | Payer: Medicare Other | Attending: Emergency Medicine | Admitting: Emergency Medicine

## 2021-04-05 DIAGNOSIS — J069 Acute upper respiratory infection, unspecified: Secondary | ICD-10-CM | POA: Diagnosis not present

## 2021-04-05 DIAGNOSIS — Z20822 Contact with and (suspected) exposure to covid-19: Secondary | ICD-10-CM | POA: Diagnosis not present

## 2021-04-05 DIAGNOSIS — J029 Acute pharyngitis, unspecified: Secondary | ICD-10-CM | POA: Diagnosis present

## 2021-04-05 LAB — RESP PANEL BY RT-PCR (FLU A&B, COVID) ARPGX2
Influenza A by PCR: NEGATIVE
Influenza B by PCR: NEGATIVE
SARS Coronavirus 2 by RT PCR: NEGATIVE

## 2021-04-05 NOTE — ED Provider Notes (Signed)
?[facility] EMERGENCY DEPARTMENT ?Provider Note ? ? ?CSN: [id number] ?Arrival date & time: 04/05/21  1721 ? ?  ? ?History ? ?Chief Complaint  ?Patient presents with  ? Sore Throat  ? ? ?Craig [name] is a 46 y.o. male. ? ?Patient complains of cough and congestion for the past 2 days patient reports he has sinus congestion and sinus pressure patient reports nasal congestion he denies any known exposure to COVID or influenza.  Patient has not been around anyone who had strep throat. ? ?The history is provided by the patient. No language interpreter was used.  ?Sore Throat ?This is a new problem. The problem has not changed since onset.Pertinent negatives include no chest pain. Nothing aggravates the symptoms. Nothing relieves the symptoms. He has tried nothing for the symptoms. The treatment provided no relief.  ? ?  ? ?Home Medications ?Prior to Admission medications   ?Medication Sig Start Date End Date Taking? Authorizing Provider  ?predniSONE (DELTASONE) 20 MG tablet 2 tabs po daily x 4 days ?Patient not taking: Reported on 01/29/2018 11/11/17   Mesner, Barbara Cower, MD  ?traMADol Janean Sark) 50 MG tablet 1 po q d prn 05/16/18   Cammy Copa, MD  ?   ? ?Allergies    ?Penicillins   ? ?Review of Systems   ?Review of Systems  ?Cardiovascular:  Negative for chest pain.  ?All other systems reviewed and are negative. ? ?Physical Exam ?Updated Vital Signs ?BP 115/86 (BP Location: Right Arm)   Pulse 73   Temp 98.4 ?F (36.9 ?C) (Oral)   Resp 16   Ht 5\' 5"  (1.651 m)   Wt 81.6 kg   SpO2 95%   BMI 29.95 kg/m?  ?Physical Exam ?Vitals and nursing note reviewed.  ?Constitutional:   ?   Appearance: He is well-developed.  ?HENT:  ?   Head: Normocephalic.  ?   Right Ear: Tympanic membrane normal.  ?   Left Ear: Tympanic membrane normal.  ?   Mouth/Throat:  ?   Mouth: Mucous membranes are moist.  ?   Tonsils: No tonsillar exudate.  ?Cardiovascular:  ?   Rate and Rhythm: Normal rate.  ?Pulmonary:  ?   Effort: Pulmonary effort is  normal.  ?Abdominal:  ?   General: There is no distension.  ?Musculoskeletal:     ?   General: Normal range of motion.  ?   Cervical back: Normal range of motion.  ?Skin: ?   General: Skin is warm.  ?Neurological:  ?   General: No focal deficit present.  ?   Mental Status: He is alert and oriented to person, place, and time.  ?Psychiatric:     ?   Mood and Affect: Mood normal.  ? ? ?ED Results / Procedures / Treatments   ?Labs ?(all labs ordered are listed, but only abnormal results are displayed) ?Labs Reviewed  ?RESP PANEL BY RT-PCR (FLU A&B, COVID) ARPGX2  ? ? ?EKG ?None ? ?Radiology ?No results found. ? ?Procedures ?Procedures  ? ? ?Medications Ordered in ED ?Medications - No data to display ? ?ED Course/ Medical Decision Making/ A&P ?  ?                        ?Medical Decision Making ?Patient complains of cough and congestion for the past 2 days he has sinus congestion and sinus pressure ? ?Problems Addressed: ?Upper respiratory tract infection, unspecified type: acute illness or injury ? ?Amount and/or Complexity of Data  Reviewed ?Labs: ordered. Decision-making details documented in ED Course. ?   Details: Labs ordered reviewed and interpreted COVID is negative influenza is negative ? ?Risk ?OTC drugs. ?Risk Details: I suspect viral illness as etiology of congestion.  Patient advised Mucinex for congestion Tylenol for any discomfort he is advised to return to the emergency department if symptoms worsen or change ? ? ? ? ? ? ? ? ? ? ?Final Clinical Impression(s) / ED Diagnoses ?Final diagnoses:  ?Upper respiratory tract infection, unspecified type  ? ? ?Rx / DC Orders ?ED Discharge Orders   ? ? None  ? ?  ? ?An After Visit Summary was printed and given to the patient.  ?  ?Elson Areas, PA-C ?04/05/21 2228 ? ?  ?Jacalyn Lefevre, MD ?04/06/21 1520 ? ?

## 2021-04-05 NOTE — Discharge Instructions (Addendum)
Return if any problems.

## 2021-04-05 NOTE — ED Triage Notes (Signed)
Sore throat with runny nose for the past 2 days ?

## 2021-06-23 DIAGNOSIS — E7801 Familial hypercholesterolemia: Secondary | ICD-10-CM | POA: Diagnosis not present

## 2021-06-23 DIAGNOSIS — I10 Essential (primary) hypertension: Secondary | ICD-10-CM | POA: Diagnosis not present

## 2021-06-23 DIAGNOSIS — R7303 Prediabetes: Secondary | ICD-10-CM | POA: Diagnosis not present

## 2021-06-23 DIAGNOSIS — F17200 Nicotine dependence, unspecified, uncomplicated: Secondary | ICD-10-CM | POA: Diagnosis not present

## 2021-06-23 DIAGNOSIS — M25569 Pain in unspecified knee: Secondary | ICD-10-CM | POA: Diagnosis not present

## 2021-06-23 DIAGNOSIS — Z6836 Body mass index (BMI) 36.0-36.9, adult: Secondary | ICD-10-CM | POA: Diagnosis not present

## 2021-06-23 DIAGNOSIS — F419 Anxiety disorder, unspecified: Secondary | ICD-10-CM | POA: Diagnosis not present

## 2021-07-08 ENCOUNTER — Encounter (HOSPITAL_COMMUNITY): Payer: Self-pay

## 2021-07-08 ENCOUNTER — Other Ambulatory Visit: Payer: Self-pay

## 2021-07-08 ENCOUNTER — Emergency Department (HOSPITAL_COMMUNITY)
Admission: EM | Admit: 2021-07-08 | Discharge: 2021-07-08 | Disposition: A | Discharge status: Home / Self Care | Payer: Medicare Other | Attending: Emergency Medicine | Admitting: Emergency Medicine

## 2021-07-08 DIAGNOSIS — M542 Cervicalgia: Secondary | ICD-10-CM | POA: Insufficient documentation

## 2021-07-08 MED ORDER — NAPROXEN 500 MG PO TABS: 500.0000 mg | ORAL_TABLET | Freq: Two times a day (BID) | ORAL | 0 refills | Status: AC

## 2021-07-08 MED ORDER — DEXAMETHASONE 4 MG PO TABS
10.0000 mg | ORAL_TABLET | Freq: Once | ORAL | Status: AC
Start: 2021-07-08 — End: 2021-07-08
  Administered 2021-07-08: 10 mg via ORAL
  Filled 2021-07-08: qty 3

## 2021-07-08 MED ORDER — KETOROLAC TROMETHAMINE 15 MG/ML IJ SOLN
15.0000 mg | Freq: Once | INTRAMUSCULAR | Status: AC
  Administered 2021-07-08: 15 mg via INTRAMUSCULAR
  Filled 2021-07-08: qty 1

## 2021-07-08 MED ORDER — TIZANIDINE HCL 4 MG PO TABS: 4.0000 mg | ORAL_TABLET | Freq: Four times a day (QID) | ORAL | 0 refills | Status: DC | PRN

## 2021-07-08 MED ORDER — ACETAMINOPHEN 325 MG PO TABS
650.0000 mg | ORAL_TABLET | Freq: Once | ORAL | Status: AC
Start: 2021-07-08 — End: 2021-07-08
  Administered 2021-07-08: 650 mg via ORAL
  Filled 2021-07-08: qty 2

## 2021-07-08 NOTE — ED Triage Notes (Signed)
Pt presents with right neck pain that radiates to shoulder after helping with heavy lifting for friends. Describes the pain as burning and has little mobility.

## 2021-07-08 NOTE — ED Provider Notes (Signed)
[facility] EMERGENCY DEPARTMENT Provider Note   CSN: [id number] Arrival date & time: 07/08/21  2041     History  No chief complaint on file.   Craig [name] is a 46 y.o. male.  Patient presents chief complaint of right-sided neck pain.  Describes it as aching and sharp, ongoing for 4 days.  He said he was helping friends move things around and lift things and afterwards noticed persistent right-sided neck pain.  Denies any fall or trauma.  No new numbness or weakness.  Denies fevers or cough or vomiting or diarrhea.       Home Medications Prior to Admission medications   Medication Sig Start Date End Date Taking? Authorizing Provider  naproxen (NAPROSYN) 500 MG tablet Take 1 tablet (500 mg total) by mouth 2 (two) times daily for 7 days. 07/08/21 07/15/21 Yes Moosa Bueche, Eustace Moore, MD  tiZANidine (ZANAFLEX) 4 MG tablet Take 1 tablet (4 mg total) by mouth every 6 (six) hours as needed for up to 15 doses for muscle spasms. 07/08/21  Yes Cheryll Cockayne, MD  predniSONE (DELTASONE) 20 MG tablet 2 tabs po daily x 4 days Patient not taking: Reported on 01/29/2018 11/11/17   Mesner, Barbara Cower, MD  traMADol Janean Sark) 50 MG tablet 1 po q d prn 05/16/18   Cammy Copa, MD      Allergies    Penicillins    Review of Systems   Review of Systems  Constitutional:  Negative for fever.  HENT:  Negative for ear pain and sore throat.   Eyes:  Negative for pain.  Respiratory:  Negative for cough.   Cardiovascular:  Negative for chest pain.  Gastrointestinal:  Negative for abdominal pain.  Genitourinary:  Negative for flank pain.  Musculoskeletal:  Negative for back pain.  Skin:  Negative for color change and rash.  Neurological:  Negative for syncope.  All other systems reviewed and are negative.   Physical Exam Updated Vital Signs BP 131/78 (BP Location: Right Arm)   Pulse 84   Temp 98.5 F (36.9 C) (Oral)   Resp 20   Ht 5\' 5"  (1.651 m)   Wt 85.3 kg   SpO2 95%   BMI 31.28 kg/m   Physical Exam Constitutional:      Appearance: He is well-developed.  HENT:     Head: Normocephalic.     Nose: Nose normal.  Eyes:     Extraocular Movements: Extraocular movements intact.  Cardiovascular:     Rate and Rhythm: Normal rate.  Pulmonary:     Effort: Pulmonary effort is normal.  Musculoskeletal:     Comments: Normal passive range of motion of the neck, intact forward flexion and extension, intact approximately 50 degrees rotation to the left and 50 degrees rotation to the right.  No C or T or L-spine midline step-offs or tenderness noted.  Right lateral paraspinal tenderness noted of the C-spine.  Skin:    Coloration: Skin is not jaundiced.  Neurological:     General: No focal deficit present.     Mental Status: He is alert and oriented to person, place, and time. Mental status is at baseline.     Cranial Nerves: No cranial nerve deficit.     Motor: No weakness.     Gait: Gait normal.     Comments: Strength is 5/5 strength bilateral upper and lower extremities.     ED Results / Procedures / Treatments   Labs (all labs ordered are listed, but only abnormal results  are displayed) Labs Reviewed - No data to display  EKG None  Radiology No results found.  Procedures Procedures    Medications Ordered in ED Medications  ketorolac (TORADOL) 15 MG/ML injection 15 mg (15 mg Intramuscular Given 07/08/21 2150)  dexamethasone (DECADRON) tablet 10 mg (10 mg Oral Given 07/08/21 2150)  acetaminophen (TYLENOL) tablet 650 mg (650 mg Oral Given 07/08/21 2150)    ED Course/ Medical Decision Making/ A&P                           Medical Decision Making Risk OTC drugs. Prescription drug management.   Chart review shows prior visit in March 2023 for upper respite infection.  Patient given multiple medications here for pain.  Discharged home in stable condition, advised outpatient follow-up with his doctor within the week.  Advised may return for worsening symptoms  or any additional concerns.        Final Clinical Impression(s) / ED Diagnoses Final diagnoses:  Neck pain    Rx / DC Orders ED Discharge Orders          Ordered    naproxen (NAPROSYN) 500 MG tablet  2 times daily        07/08/21 2222    tiZANidine (ZANAFLEX) 4 MG tablet  Every 6 hours PRN        07/08/21 2222              Cheryll Cockayne, MD 07/08/21 2222

## 2021-07-08 NOTE — Discharge Instructions (Signed)
Call your primary care doctor or specialist as discussed in the next 2-3 days.   Return immediately back to the ER if:  Your symptoms worsen within the next 12-24 hours. You develop new symptoms such as new fevers, persistent vomiting, new pain, shortness of breath, or new weakness or numbness, or if you have any other concerns.  

## 2021-08-11 DIAGNOSIS — F331 Major depressive disorder, recurrent, moderate: Secondary | ICD-10-CM | POA: Diagnosis not present

## 2021-08-12 DIAGNOSIS — R109 Unspecified abdominal pain: Secondary | ICD-10-CM | POA: Diagnosis not present

## 2021-08-12 DIAGNOSIS — Z79899 Other long term (current) drug therapy: Secondary | ICD-10-CM | POA: Diagnosis not present

## 2021-08-12 DIAGNOSIS — R0789 Other chest pain: Secondary | ICD-10-CM | POA: Diagnosis not present

## 2021-08-12 DIAGNOSIS — Z7982 Long term (current) use of aspirin: Secondary | ICD-10-CM | POA: Diagnosis not present

## 2021-08-12 DIAGNOSIS — F17210 Nicotine dependence, cigarettes, uncomplicated: Secondary | ICD-10-CM | POA: Diagnosis not present

## 2021-08-12 DIAGNOSIS — R1031 Right lower quadrant pain: Secondary | ICD-10-CM | POA: Diagnosis not present

## 2021-08-12 DIAGNOSIS — I2510 Atherosclerotic heart disease of native coronary artery without angina pectoris: Secondary | ICD-10-CM | POA: Diagnosis not present

## 2021-08-12 DIAGNOSIS — E7800 Pure hypercholesterolemia, unspecified: Secondary | ICD-10-CM | POA: Diagnosis not present

## 2021-08-12 DIAGNOSIS — F319 Bipolar disorder, unspecified: Secondary | ICD-10-CM | POA: Diagnosis not present

## 2021-08-12 DIAGNOSIS — I10 Essential (primary) hypertension: Secondary | ICD-10-CM | POA: Diagnosis not present

## 2021-08-19 DIAGNOSIS — F329 Major depressive disorder, single episode, unspecified: Secondary | ICD-10-CM | POA: Diagnosis not present

## 2021-10-07 ENCOUNTER — Other Ambulatory Visit: Payer: Self-pay

## 2021-10-07 ENCOUNTER — Encounter (HOSPITAL_COMMUNITY): Payer: Self-pay

## 2021-10-07 DIAGNOSIS — H538 Other visual disturbances: Secondary | ICD-10-CM | POA: Insufficient documentation

## 2021-10-07 DIAGNOSIS — Z5321 Procedure and treatment not carried out due to patient leaving prior to being seen by health care provider: Secondary | ICD-10-CM | POA: Diagnosis not present

## 2021-10-07 DIAGNOSIS — R519 Headache, unspecified: Secondary | ICD-10-CM | POA: Diagnosis not present

## 2021-10-07 NOTE — ED Triage Notes (Signed)
Pt to er, pt states that he is here for a headache for the past two weeks, states that it started on the L side and now has moved over to the R side, states that at times his headache makes his vision blurry.  Denies dizziness or blurry vision at this time

## 2021-10-08 ENCOUNTER — Emergency Department (HOSPITAL_COMMUNITY)
Admission: EM | Admit: 2021-10-08 | Discharge: 2021-10-08 | Discharge status: Against Medical Advice | Payer: Medicare Other | Attending: Emergency Medicine | Admitting: Emergency Medicine

## 2021-10-12 ENCOUNTER — Encounter (HOSPITAL_COMMUNITY): Payer: Self-pay | Admitting: *Deleted

## 2021-10-12 ENCOUNTER — Emergency Department (HOSPITAL_COMMUNITY)
Admission: EM | Admit: 2021-10-12 | Discharge: 2021-10-12 | Discharge status: Against Medical Advice | Payer: Medicare Other | Attending: Emergency Medicine | Admitting: Emergency Medicine

## 2021-10-12 ENCOUNTER — Other Ambulatory Visit: Payer: Self-pay

## 2021-10-12 ENCOUNTER — Emergency Department (HOSPITAL_COMMUNITY): Payer: Medicare Other

## 2021-10-12 DIAGNOSIS — Z5329 Procedure and treatment not carried out because of patient's decision for other reasons: Secondary | ICD-10-CM | POA: Diagnosis not present

## 2021-10-12 DIAGNOSIS — R519 Headache, unspecified: Secondary | ICD-10-CM | POA: Diagnosis not present

## 2021-10-12 DIAGNOSIS — H538 Other visual disturbances: Secondary | ICD-10-CM | POA: Diagnosis not present

## 2021-10-12 MED ORDER — KETOROLAC TROMETHAMINE 60 MG/2ML IM SOLN: 60.0000 mg | Freq: Once | INTRAMUSCULAR | Status: DC
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
## 2021-10-12 NOTE — ED Notes (Signed)
Pt is not in room.  

## 2021-10-12 NOTE — ED Triage Notes (Signed)
Pt here for c/o HA x 3 weeks. States vision is blurry at times.

## 2021-10-29 ENCOUNTER — Ambulatory Visit: Payer: Medicare Other | Admitting: Orthopedic Surgery

## 2021-11-09 ENCOUNTER — Encounter (HOSPITAL_COMMUNITY): Payer: Self-pay | Admitting: *Deleted

## 2021-11-09 ENCOUNTER — Ambulatory Visit (INDEPENDENT_AMBULATORY_CARE_PROVIDER_SITE_OTHER): Payer: Medicare Other

## 2021-11-09 ENCOUNTER — Ambulatory Visit (HOSPITAL_COMMUNITY)
Admission: EM | Admit: 2021-11-09 | Discharge: 2021-11-09 | Disposition: A | Discharge status: Home / Self Care | Payer: Medicare Other | Attending: Internal Medicine | Admitting: Internal Medicine

## 2021-11-09 ENCOUNTER — Other Ambulatory Visit: Payer: Self-pay

## 2021-11-09 DIAGNOSIS — W2209XA Striking against other stationary object, initial encounter: Secondary | ICD-10-CM

## 2021-11-09 DIAGNOSIS — M7989 Other specified soft tissue disorders: Secondary | ICD-10-CM

## 2021-11-09 DIAGNOSIS — M79641 Pain in right hand: Secondary | ICD-10-CM

## 2021-11-09 NOTE — ED Triage Notes (Signed)
Pt reports he hit a brick wall last night and he now has pain and swelling to RT hand.

## 2021-11-09 NOTE — Discharge Instructions (Addendum)
Your x-ray today was negative showing no fracture.  We applied an Ace bandage in office today support healing soft tissue.  You may use rest, ice, compression, and elevation of hand with healing.  I have attached information the back of your paperwork.   As discussed, you can take ibuprofen every 6 hours as needed (400-600mg ), do not take more than 2400 mg in a 24-hour period.
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
## 2021-11-11 ENCOUNTER — Ambulatory Visit: Payer: Medicare Other | Admitting: Orthopedic Surgery

## 2022-02-27 ENCOUNTER — Emergency Department (HOSPITAL_COMMUNITY): Payer: Medicare Other

## 2022-02-27 ENCOUNTER — Other Ambulatory Visit: Payer: Self-pay

## 2022-02-27 ENCOUNTER — Emergency Department (HOSPITAL_COMMUNITY)
Admission: EM | Admit: 2022-02-27 | Discharge: 2022-02-27 | Disposition: A | Discharge status: Home / Self Care | Payer: Medicare Other | Attending: Emergency Medicine | Admitting: Emergency Medicine

## 2022-02-27 ENCOUNTER — Encounter (HOSPITAL_COMMUNITY): Payer: Self-pay

## 2022-02-27 DIAGNOSIS — R519 Headache, unspecified: Secondary | ICD-10-CM | POA: Diagnosis not present

## 2022-02-27 DIAGNOSIS — R079 Chest pain, unspecified: Secondary | ICD-10-CM | POA: Diagnosis not present

## 2022-02-27 DIAGNOSIS — R42 Dizziness and giddiness: Secondary | ICD-10-CM | POA: Insufficient documentation

## 2022-02-27 DIAGNOSIS — R0789 Other chest pain: Secondary | ICD-10-CM | POA: Diagnosis not present

## 2022-02-27 LAB — TROPONIN I (HIGH SENSITIVITY): Troponin I (High Sensitivity): 3 ng/L (ref <18)

## 2022-02-27 LAB — CBC
HCT: 52 % (ref 39.0–52.0)
Hemoglobin: 17.4 g/dL — ABNORMAL HIGH (ref 13.0–17.0)
MCH: 31.6 pg (ref 26.0–34.0)
MCHC: 33.5 g/dL (ref 30.0–36.0)
MCV: 94.5 fL (ref 80.0–100.0)
Platelets: 294 10*3/uL (ref 150–400)
RBC: 5.5 MIL/uL (ref 4.22–5.81)
RDW: 14.4 % (ref 11.5–15.5)
WBC: 14 10*3/uL — ABNORMAL HIGH (ref 4.0–10.5)
nRBC: 0 % (ref 0.0–0.2)

## 2022-02-27 LAB — BASIC METABOLIC PANEL
Anion gap: 9 (ref 5–15)
BUN: 13 mg/dL (ref 6–20)
CO2: 24 mmol/L (ref 22–32)
Calcium: 9.6 mg/dL (ref 8.9–10.3)
Chloride: 103 mmol/L (ref 98–111)
Creatinine, Ser: 0.82 mg/dL (ref 0.61–1.24)
GFR, Estimated: >60 mL/min (ref >60)
Glucose, Bld: 119 mg/dL — ABNORMAL HIGH (ref 70–99)
Potassium: 4.1 mmol/L (ref 3.5–5.1)
Sodium: 136 mmol/L (ref 135–145)

## 2022-02-27 MED ORDER — PROCHLORPERAZINE EDISYLATE 10 MG/2ML IJ SOLN
10.0000 mg | Freq: Once | INTRAMUSCULAR | Status: AC
  Administered 2022-02-27: 10 mg via INTRAVENOUS
  Filled 2022-02-27: qty 2

## 2022-02-27 MED ORDER — KETOROLAC TROMETHAMINE 15 MG/ML IJ SOLN
15.0000 mg | Freq: Once | INTRAMUSCULAR | Status: AC
  Administered 2022-02-27: 15 mg via INTRAVENOUS
  Filled 2022-02-27: qty 1

## 2022-02-27 MED ORDER — SODIUM CHLORIDE 0.9 % IV BOLUS
1000.0000 mL | Freq: Once | INTRAVENOUS | Status: AC
  Administered 2022-02-27: 1000 mL via INTRAVENOUS

## 2022-02-27 MED ORDER — DIPHENHYDRAMINE HCL 50 MG/ML IJ SOLN
12.5000 mg | Freq: Once | INTRAMUSCULAR | Status: AC
  Administered 2022-02-27: 12.5 mg via INTRAVENOUS
  Filled 2022-02-27: qty 1

## 2022-02-27 NOTE — ED Provider Notes (Signed)
[facility] Provider Note   CSN: [id number] Arrival date & time: 02/27/22  1432     History  Chief Complaint  Patient presents with   Headache    Craig [name] is a 47 y.o. male presents the ED complaining of headache that began 6 days ago.  He reports associated occasional dizzy spells with the headache, but he has only had a couple of them.  He has history of headaches, but states this one feels different to him.  He has associated photophobia with his headache.  Patient also complains of central, substernal chest pain that began a few days ago, he describes it as intermittent and that he thought it was "just gas".  Denies nausea, vomiting, fever, visual disturbance, chest tightness, shortness of breath, visual disturbance, syncope, or lightheadedness.        Home Medications Prior to Admission medications   Not on File      Allergies    Penicillins    Review of Systems   Review of Systems  Constitutional:  Negative for fever.  Eyes:  Positive for photophobia. Negative for visual disturbance.  Respiratory:  Positive for cough (chronic). Negative for chest tightness and shortness of breath.   Cardiovascular:  Positive for chest pain (intermittent).  Gastrointestinal:  Negative for nausea and vomiting.  Neurological:  Positive for dizziness (intermittent) and headaches. Negative for syncope and light-headedness.    Physical Exam Updated Vital Signs BP 122/75   Pulse 87   Temp 98.1 F (36.7 C) (Oral)   Resp 20   Ht 5' 5"$  (1.651 m)   Wt 81.6 kg   SpO2 96%   BMI 29.95 kg/m  Physical Exam Vitals and nursing note reviewed.  Constitutional:      General: He is not in acute distress.    Appearance: He is not ill-appearing.  HENT:     Mouth/Throat:     Mouth: Mucous membranes are moist.     Pharynx: Oropharynx is clear.  Eyes:     General: Lids are normal.     Extraocular Movements: Extraocular movements intact.      Conjunctiva/sclera: Conjunctivae normal.     Pupils: Pupils are equal, round, and reactive to light.  Cardiovascular:     Rate and Rhythm: Normal rate and regular rhythm.     Pulses: Normal pulses.     Heart sounds: Normal heart sounds.  Pulmonary:     Effort: Pulmonary effort is normal. No respiratory distress.     Breath sounds: Normal air entry. Rhonchi (cleared with coughing) present.  Abdominal:     General: Abdomen is flat. Bowel sounds are normal. There is no distension.     Palpations: Abdomen is soft.     Tenderness: There is no abdominal tenderness.  Musculoskeletal:     Cervical back: Full passive range of motion without pain.  Skin:    General: Skin is warm and dry.     Capillary Refill: Capillary refill takes less than 2 seconds.  Neurological:     General: No focal deficit present.     Mental Status: He is alert and oriented to person, place, and time. Mental status is at baseline.     Cranial Nerves: Cranial nerves 2-12 are intact.     Sensory: Sensation is intact.     Motor: Motor function is intact.     Coordination: Coordination is intact.     Gait: Gait is intact.     Comments:  Cranial Nerves:  II: peripheral fields grossly intact III,IV, VI: ptosis not present, extra-ocular movements intact bilaterally, direct and consensual pupillary light reflexes intact bilaterally V: facial sensation, jaw opening, and bite strength equal bilaterally VII: eyebrow raise, eyelid close, smile, frown, pucker equal bilaterally VIII: hearing grossly normal bilaterally  IX,X: palate elevation and swallowing intact XI: bilateral shoulder shrug and lateral head rotation equal and strong XII: midline tongue extension   Psychiatric:        Mood and Affect: Mood normal.        Behavior: Behavior normal.     ED Results / Procedures / Treatments   Labs (all labs ordered are listed, but only abnormal results are displayed) Labs Reviewed  BASIC METABOLIC PANEL - Abnormal; Notable  for the following components:      Result Value   Glucose, Bld 119 (*)    All other components within normal limits  CBC - Abnormal; Notable for the following components:   WBC 14.0 (*)    Hemoglobin 17.4 (*)    All other components within normal limits  TROPONIN I (HIGH SENSITIVITY)    EKG EKG Interpretation  Date/Time:  Sunday February 27 2022 14:54:09 EST Ventricular Rate:  79 PR Interval:  206 QRS Duration: 106 QT Interval:  340 QTC Calculation: 389 R Axis:   232 Text Interpretation: Normal sinus rhythm Right superior axis deviation Pulmonary disease pattern Abnormal ECG When compared with ECG of 11-Nov-2017 00:46, PREVIOUS ECG IS PRESENT No significant change since last tracing Confirmed by Isla Pence [phone]) on 02/27/2022 3:55:45 PM  Radiology DG Chest 1 View  Result Date: 02/27/2022 CLINICAL DATA:  Chest pain EXAM: CHEST  1 VIEW COMPARISON:  11/11/2017 FINDINGS: The heart size and mediastinal contours are within normal limits. No focal airspace consolidation, pleural effusion, or pneumothorax. The visualized skeletal structures are unremarkable. IMPRESSION: No active disease. Electronically Signed   By: Davina Poke D.O.   On: 02/27/2022 15:43    Procedures Procedures    Medications Ordered in ED Medications  ketorolac (TORADOL) 15 MG/ML injection 15 mg (15 mg Intravenous Given 02/27/22 1637)  prochlorperazine (COMPAZINE) injection 10 mg (10 mg Intravenous Given 02/27/22 1637)  diphenhydrAMINE (BENADRYL) injection 12.5 mg (12.5 mg Intravenous Given 02/27/22 1637)  sodium chloride 0.9 % bolus 1,000 mL (1,000 mLs Intravenous New Bag/Given 02/27/22 1636)    ED Course/ Medical Decision Making/ A&P                             Medical Decision Making Amount and/or Complexity of Data Reviewed Labs: ordered. Radiology: ordered.  Risk Prescription drug management.   This patient presents to the ED with chief complaint(s) of headache with associated dizziness and  photophobia as well as central chest pain with pertinent past medical history of tobacco use (2 packs/day).  The complaint involves an extensive differential diagnosis and also carries with it a high risk of complications and morbidity.    The differential diagnosis includes migraine headache, ACS, tension headache, cluster headache, GERD, pneumonia   The initial plan is to obtain baseline labs including troponin  Additional history obtained: Additional history obtained from  none Records reviewed  none  Initial Assessment:   On exam, patient appears to be uncomfortable, but is not in acute distress.  He is A&Ox3. PERRLA.  CN 2-12 are intact.  He has normal motor function and coordination of upper and lower extremities.  5/5 strength in all extremities.  Lungs with rhonchi auscultated in the right lung fields, however, this was cleared with coughing.  No other adventitious breath sounds.  Tidal volume is adequate.  Heart rate is normal in the 80s with regular rhythm.  No murmurs.  No appreciable bilateral pedal edema.  Skin is warm and dry.  Abdomen is soft and non tender to palpation.    Independent ECG/labs interpretation:  The following labs were independently interpreted:  CBC with mild leukocytosis and erythrocytosis, which appears to be patient's baseline and likely related to his tobacco use.  Metabolic panel without major electrolyte disturbances and with normal renal function.  Troponin negative.  ECG demonstrates sinus rhythm without evidence of ischemia, infarction, or ectopy   Independent visualization and interpretation of imaging: I independently visualized the following imaging with scope of interpretation limited to determining acute life threatening conditions related to emergency care: chest x-ray, which revealed no evidence of pneumonia, pleural effusion, pulmonary edema, or pneumothorax.  I agree with radiologist interpretation.   Treatment and Reassessment: Patient treated  with fluid bolus, IV Toradol, Benadryl, and Compazine with improvement in his symptoms.  He states his headache has significantly improved and he is also no longer having chest pain.  Discussed use of NSAIDs for headache and primary care follow-up should he continue to have headaches and/or intermittent chest pain.   Disposition:   The patient has been appropriately medically screened and/or stabilized in the ED. I have low suspicion for any other emergent medical condition which would require further screening, evaluation or treatment in the ED or require inpatient management. At time of discharge the patient is hemodynamically stable and in no acute distress. I have discussed work-up results and diagnosis with patient and answered all questions. Patient is agreeable with discharge plan. We discussed strict return precautions for returning to the emergency department and they verbalized understanding.            Final Clinical Impression(s) / ED Diagnoses Final diagnoses:  Acute nonintractable headache, unspecified headache type  Chest pain, unspecified type    Rx / DC Orders ED Discharge Orders     None         Pat Kocher, [place] 02/27/22 1830    Isla Pence, MD 02/27/22 1916

## 2022-02-27 NOTE — Discharge Instructions (Addendum)
Thank you for allowing me to be part of your care today.  Your work up was overall reassuring.  You were given a combination of medications to treat migraine headache while in the ER.  I recommend following up with your primary care provider should you continue to have persistent headaches/migraines or intermittent chest pain.    I recommend taking ibuprofen 800 mg every 6-8 hours as needed for headache.  You may alternate this with 1000 mg of Tylenol.  DO NOT exceed 3200 mg of ibuprofen in a 24-hour period.  DO NOT exceed 4000 mg of Tylenol in a 24-hour period.    Return to the ER if you have worsening of your symptoms or if you have any new concerns.

## 2022-02-27 NOTE — ED Triage Notes (Signed)
Pt c/o headache that started several days ago with intermittent dizziness. States that he has history of headaches but that this feels different. Denies any n/v, does admit to being sensitive to the light.

## 2022-02-27 NOTE — ED Notes (Signed)
Pt also reports intermittent chest pain located mid sternal that has been intermittent for the past few days as well,

## 2022-09-15 DIAGNOSIS — R030 Elevated blood-pressure reading, without diagnosis of hypertension: Secondary | ICD-10-CM | POA: Diagnosis not present

## 2022-09-15 DIAGNOSIS — U071 COVID-19: Secondary | ICD-10-CM | POA: Diagnosis not present

## 2022-09-15 DIAGNOSIS — E669 Obesity, unspecified: Secondary | ICD-10-CM | POA: Diagnosis not present

## 2022-09-15 DIAGNOSIS — Z6833 Body mass index (BMI) 33.0-33.9, adult: Secondary | ICD-10-CM | POA: Diagnosis not present

## 2022-09-30 DIAGNOSIS — Z0001 Encounter for general adult medical examination with abnormal findings: Secondary | ICD-10-CM | POA: Diagnosis not present

## 2022-09-30 DIAGNOSIS — I10 Essential (primary) hypertension: Secondary | ICD-10-CM | POA: Diagnosis not present

## 2022-09-30 DIAGNOSIS — R7303 Prediabetes: Secondary | ICD-10-CM | POA: Diagnosis not present

## 2022-09-30 DIAGNOSIS — E7801 Familial hypercholesterolemia: Secondary | ICD-10-CM | POA: Diagnosis not present

## 2022-09-30 DIAGNOSIS — E559 Vitamin D deficiency, unspecified: Secondary | ICD-10-CM | POA: Diagnosis not present

## 2022-09-30 DIAGNOSIS — Z1329 Encounter for screening for other suspected endocrine disorder: Secondary | ICD-10-CM | POA: Diagnosis not present

## 2022-10-05 DIAGNOSIS — Z0001 Encounter for general adult medical examination with abnormal findings: Secondary | ICD-10-CM | POA: Diagnosis not present

## 2022-10-05 DIAGNOSIS — I10 Essential (primary) hypertension: Secondary | ICD-10-CM | POA: Diagnosis not present

## 2022-10-05 DIAGNOSIS — E7801 Familial hypercholesterolemia: Secondary | ICD-10-CM | POA: Diagnosis not present

## 2022-10-06 DIAGNOSIS — E7801 Familial hypercholesterolemia: Secondary | ICD-10-CM | POA: Diagnosis not present

## 2022-10-06 DIAGNOSIS — R7303 Prediabetes: Secondary | ICD-10-CM | POA: Diagnosis not present

## 2022-10-06 DIAGNOSIS — I10 Essential (primary) hypertension: Secondary | ICD-10-CM | POA: Diagnosis not present

## 2022-10-06 DIAGNOSIS — M25569 Pain in unspecified knee: Secondary | ICD-10-CM | POA: Diagnosis not present

## 2022-10-06 DIAGNOSIS — F17200 Nicotine dependence, unspecified, uncomplicated: Secondary | ICD-10-CM | POA: Diagnosis not present

## 2022-10-06 DIAGNOSIS — Z6838 Body mass index (BMI) 38.0-38.9, adult: Secondary | ICD-10-CM | POA: Diagnosis not present

## 2022-10-06 DIAGNOSIS — F419 Anxiety disorder, unspecified: Secondary | ICD-10-CM | POA: Diagnosis not present

## 2022-10-10 ENCOUNTER — Ambulatory Visit: Payer: Medicare Other | Admitting: Orthopedic Surgery

## 2022-10-24 ENCOUNTER — Ambulatory Visit: Payer: Medicare Other | Admitting: Orthopedic Surgery

## 2022-10-24 ENCOUNTER — Other Ambulatory Visit (INDEPENDENT_AMBULATORY_CARE_PROVIDER_SITE_OTHER): Payer: Medicare Other

## 2022-10-24 DIAGNOSIS — S83512A Sprain of anterior cruciate ligament of left knee, initial encounter: Secondary | ICD-10-CM | POA: Diagnosis not present

## 2022-10-24 DIAGNOSIS — M25562 Pain in left knee: Secondary | ICD-10-CM | POA: Diagnosis not present

## 2022-10-27 ENCOUNTER — Encounter: Payer: Self-pay | Admitting: Orthopedic Surgery
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
## 2022-11-01 ENCOUNTER — Ambulatory Visit
Admission: RE | Admit: 2022-11-01 | Discharge: 2022-11-01 | Disposition: A | Discharge status: Home / Self Care | Payer: Medicare Other | Source: Ambulatory Visit | Attending: Orthopedic Surgery | Admitting: Orthopedic Surgery

## 2022-11-01 DIAGNOSIS — S83512A Sprain of anterior cruciate ligament of left knee, initial encounter: Secondary | ICD-10-CM | POA: Diagnosis not present

## 2022-11-01 DIAGNOSIS — M25562 Pain in left knee: Secondary | ICD-10-CM

## 2022-11-01 DIAGNOSIS — M25462 Effusion, left knee: Secondary | ICD-10-CM | POA: Diagnosis not present

## 2022-11-09 ENCOUNTER — Ambulatory Visit (INDEPENDENT_AMBULATORY_CARE_PROVIDER_SITE_OTHER): Payer: Medicare Other | Admitting: Orthopedic Surgery

## 2022-11-09 DIAGNOSIS — S83512A Sprain of anterior cruciate ligament of left knee, initial encounter: Secondary | ICD-10-CM

## 2022-11-12 ENCOUNTER — Encounter: Payer: Self-pay | Admitting: Orthopedic Surgery
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
## 2022-11-16 ENCOUNTER — Telehealth: Payer: Self-pay | Admitting: Orthopedic Surgery

## 2022-11-16 NOTE — Telephone Encounter (Signed)
Patient called and said that something is pulling on the inside of his right leg. CB#[phone]

## 2022-12-30 ENCOUNTER — Telehealth: Payer: Self-pay | Admitting: Orthopedic Surgery

## 2022-12-30 NOTE — Telephone Encounter (Signed)
Patient called wanting to know if he was going to need lab work for his left knee ACL reconstruction on 01-24-23.  I put patient on hold and called [employer] but was not able to speak with her directly, so message left on her voicemail providing this patient's name, date of birth, and surgery date.  I told patient he should get a call from a pre-op nurse soon, however this is an extremely busy time for surgical cases at the end of the year.  If patient has not heard anything a week from today, he will call back and follow up with me.

## 2023-01-09 NOTE — Pre-Procedure Instructions (Signed)
Surgical Instructions   Your procedure is scheduled on January 23, 2022. Report to [facility] Main Entrance "A" at 5:30 A.M., then check in with the Admitting office. Any questions or running late day of surgery: call [phone]  Questions prior to your surgery date: call [phone], Monday-Friday, 8am-4pm. If you experience any cold or flu symptoms such as cough, fever, chills, shortness of breath, etc. between now and your scheduled surgery, please notify us at the above number.     Remember:  Do not eat after midnight the night before your surgery   You may drink clear liquids until 4:30AM the morning of your surgery.   Clear liquids allowed are: Water, Non-Citrus Juices (without pulp), Carbonated Beverages, Clear Tea (no milk, honey, etc.), Black Coffee Only (NO MILK, CREAM OR POWDERED CREAMER of any kind), and Gatorade.  Patient Instructions  The night before surgery:  No food after midnight. ONLY clear liquids after midnight  The day of surgery (if you do NOT have diabetes):  Drink ONE (1) Pre-Surgery Clear Ensure by 4:30AM the morning of surgery. Drink in one sitting. Do not sip.  This drink was given to you during your hospital  pre-op appointment visit.  Nothing else to drink after completing the  Pre-Surgery Clear Ensure.         If you have questions, please contact your surgeon's office.    Take these medicines the morning of surgery with A SIP OF WATER: NONE   May take these medicines IF NEEDED:    One week prior to surgery, STOP taking any Aspirin (unless otherwise instructed by your surgeon) Aleve, Naproxen, Ibuprofen, Motrin, Advil, Goody's, BC's, all herbal medications, fish oil, and non-prescription vitamins.                     Do NOT Smoke (Tobacco/Vaping) for 24 hours prior to your procedure.  If you use a CPAP at night, you may bring your mask/headgear for your overnight stay.   You will be asked to remove any contacts, glasses, piercing's,  hearing aid's, dentures/partials prior to surgery. Please bring cases for these items if needed.    Patients discharged the day of surgery will not be allowed to drive home, and someone needs to stay with them for 24 hours.  SURGICAL WAITING ROOM VISITATION Patients may have no more than 2 support people in the waiting area - these visitors may rotate.   Pre-op nurse will coordinate an appropriate time for 1 ADULT support person, who may not rotate, to accompany patient in pre-op.  Children under the age of 22 must have an adult with them who is not the patient and must remain in the main waiting area with an adult.  If the patient needs to stay at the hospital during part of their recovery, the visitor guidelines for inpatient rooms apply.  Please refer to the [facility] website for the visitor guidelines for any additional information.   If you received a COVID test during your pre-op visit  it is requested that you wear a mask when out in public, stay away from anyone that may not be feeling well and notify your surgeon if you develop symptoms. If you have been in contact with anyone that has tested positive in the last 10 days please notify you surgeon.      Pre-operative CHG Bathing Instructions   You can play a key role in reducing the risk of infection after surgery. Your skin needs to be  as free of germs as possible. You can reduce the number of germs on your skin by washing with CHG (chlorhexidine gluconate) soap before surgery. CHG is an antiseptic soap that kills germs and continues to kill germs even after washing.   DO NOT use if you have an allergy to chlorhexidine/CHG or antibacterial soaps. If your skin becomes reddened or irritated, stop using the CHG and notify one of our RNs at [phone].              TAKE A SHOWER THE NIGHT BEFORE SURGERY AND THE DAY OF SURGERY    Please keep in mind the following:  DO NOT shave, including legs and underarms, 48 hours prior to  surgery.   You may shave your face before/day of surgery.  Place clean sheets on your bed the night before surgery Use a clean washcloth (not used since being washed) for each shower. DO NOT sleep with pet's night before surgery.  CHG Shower Instructions:  Wash your face and private area with normal soap. If you choose to wash your hair, wash first with your normal shampoo.  After you use shampoo/soap, rinse your hair and body thoroughly to remove shampoo/soap residue.  Turn the water OFF and apply half the bottle of CHG soap to a CLEAN washcloth.  Apply CHG soap ONLY FROM YOUR NECK DOWN TO YOUR TOES (washing for 3-5 minutes)  DO NOT use CHG soap on face, private areas, open wounds, or sores.  Pay special attention to the area where your surgery is being performed.  If you are having back surgery, having someone wash your back for you may be helpful. Wait 2 minutes after CHG soap is applied, then you may rinse off the CHG soap.  Pat dry with a clean towel  Put on clean pajamas    Additional instructions for the day of surgery: DO NOT APPLY any lotions, deodorants, cologne, or perfumes.   Do not wear jewelry or makeup Do not wear nail polish, gel polish, artificial nails, or any other type of covering on natural nails (fingers and toes) Do not bring valuables to the hospital. [facility] is not responsible for valuables/personal belongings. Put on clean/comfortable clothes.  Please brush your teeth.  Ask your nurse before applying any prescription medications to the skin.

## 2023-01-12 ENCOUNTER — Encounter (HOSPITAL_COMMUNITY): Payer: Self-pay

## 2023-01-12 ENCOUNTER — Other Ambulatory Visit: Payer: Self-pay

## 2023-01-12 ENCOUNTER — Encounter (HOSPITAL_COMMUNITY)
Admission: RE | Admit: 2023-01-12 | Discharge: 2023-01-12 | Disposition: A | Discharge status: Home / Self Care | Payer: Medicare Other | Source: Ambulatory Visit | Attending: Orthopedic Surgery | Admitting: Orthopedic Surgery

## 2023-01-12 DIAGNOSIS — Z01812 Encounter for preprocedural laboratory examination: Secondary | ICD-10-CM | POA: Insufficient documentation

## 2023-01-12 DIAGNOSIS — Z01818 Encounter for other preprocedural examination: Secondary | ICD-10-CM

## 2023-01-12 HISTORY — DX: Prediabetes: R73.03

## 2023-01-12 HISTORY — DX: Personal history of urinary calculi: Z87.442

## 2023-01-12 LAB — BASIC METABOLIC PANEL
Anion gap: 11 (ref 5–15)
BUN: 12 mg/dL (ref 6–20)
CO2: 23 mmol/L (ref 22–32)
Calcium: 9.5 mg/dL (ref 8.9–10.3)
Chloride: 104 mmol/L (ref 98–111)
Creatinine, Ser: 0.92 mg/dL (ref 0.61–1.24)
GFR, Estimated: >60 mL/min (ref >60)
Glucose, Bld: 134 mg/dL — ABNORMAL HIGH (ref 70–99)
Potassium: 4.6 mmol/L (ref 3.5–5.1)
Sodium: 138 mmol/L (ref 135–145)

## 2023-01-12 LAB — CBC
HCT: 51.1 % (ref 39.0–52.0)
Hemoglobin: 16.5 g/dL (ref 13.0–17.0)
MCH: 31.3 pg (ref 26.0–34.0)
MCHC: 32.3 g/dL (ref 30.0–36.0)
MCV: 97 fL (ref 80.0–100.0)
Platelets: 252 10*3/uL (ref 150–400)
RBC: 5.27 MIL/uL (ref 4.22–5.81)
RDW: 13.9 % (ref 11.5–15.5)
WBC: 12.5 10*3/uL — ABNORMAL HIGH (ref 4.0–10.5)
nRBC: 0 % (ref 0.0–0.2)

## 2023-01-12 NOTE — Progress Notes (Signed)
PCP - no pcp Cardiologist - denies  PPM/ICD - denies   Chest x-ray - n/a EKG - denies Stress Test - over 10 years ago, normal per patient ECHO - denies Cardiac Cath - denies  Sleep Study - denies OSA  7 days prior to surgery STOP taking any Aspirin (unless otherwise instructed by your surgeon), Aleve, Naproxen, Ibuprofen, Motrin, Advil, Goody's, BC's, all herbal medications, fish oil, and all vitamins.   ERAS Protcol -yes PRE-SURGERY Ensure or G2- ensure ordered and given  COVID TEST- not needed   Anesthesia review: no  Patient denies shortness of breath, fever, cough and chest pain at PAT appointment   All instructions explained to the patient, with a verbal understanding of the material. Patient agrees to go over the instructions while at home for a better understanding. Patient also instructed to self quarantine after being tested for COVID-19. The opportunity to ask questions was provided.

## 2023-01-23 NOTE — Anesthesia Preprocedure Evaluation (Addendum)
 Anesthesia Evaluation  Patient identified by MRN, date of birth, ID band Patient awake    Reviewed: Allergy & Precautions, NPO status , Patient's Chart, lab work & pertinent test results  Airway Mallampati: II  TM Distance: >3 FB Neck ROM: Full    Dental  (+) Edentulous Upper, Dental Advisory Given   Pulmonary Current Smoker and Patient abstained from smoking.   Pulmonary exam normal breath sounds clear to auscultation       Cardiovascular negative cardio ROS Normal cardiovascular exam Rhythm:Regular Rate:Normal     Neuro/Psych  PSYCHIATRIC DISORDERS Anxiety Depression Bipolar Disorder Schizophrenia  negative neurological ROS     GI/Hepatic negative GI ROS, Neg liver ROS,,,  Endo/Other  negative endocrine ROS    Renal/GU negative Renal ROS  negative genitourinary   Musculoskeletal negative musculoskeletal ROS (+)    Abdominal   Peds  Hematology negative hematology ROS (+)   Anesthesia Other Findings   Reproductive/Obstetrics                             Anesthesia Physical Anesthesia Plan  ASA: 2  Anesthesia Plan: General and Regional   Post-op Pain Management: Regional block* and Tylenol  PO (pre-op)*   Induction: Intravenous  PONV Risk Score and Plan: 1 and Midazolam , Dexamethasone  and Ondansetron   Airway Management Planned: Oral ETT  Additional Equipment:   Intra-op Plan:   Post-operative Plan: Extubation in OR  Informed Consent: I have reviewed the patients History and Physical, chart, labs and discussed the procedure including the risks, benefits and alternatives for the proposed anesthesia with the patient or authorized representative who has indicated his/her understanding and acceptance.     Dental advisory given  Plan Discussed with: CRNA  Anesthesia Plan Comments:        Anesthesia Quick Evaluation

## 2023-01-24 ENCOUNTER — Encounter (HOSPITAL_COMMUNITY)
Admission: RE | Disposition: A | Discharge status: Home / Self Care | Payer: Self-pay | Source: Home / Self Care | Attending: Orthopedic Surgery

## 2023-01-24 ENCOUNTER — Other Ambulatory Visit (HOSPITAL_COMMUNITY): Payer: Self-pay

## 2023-01-24 ENCOUNTER — Ambulatory Visit (HOSPITAL_COMMUNITY): Payer: Self-pay | Admitting: Anesthesiology

## 2023-01-24 ENCOUNTER — Other Ambulatory Visit: Payer: Self-pay

## 2023-01-24 ENCOUNTER — Ambulatory Visit (HOSPITAL_COMMUNITY): Payer: Medicare Other

## 2023-01-24 ENCOUNTER — Ambulatory Visit (HOSPITAL_COMMUNITY)
Admission: RE | Admit: 2023-01-24 | Discharge: 2023-01-24 | Disposition: A | Discharge status: Home / Self Care | Payer: Medicare Other | Attending: Orthopedic Surgery | Admitting: Orthopedic Surgery

## 2023-01-24 ENCOUNTER — Encounter (HOSPITAL_COMMUNITY): Payer: Self-pay | Admitting: Orthopedic Surgery

## 2023-01-24 ENCOUNTER — Ambulatory Visit (HOSPITAL_BASED_OUTPATIENT_CLINIC_OR_DEPARTMENT_OTHER): Payer: Medicare Other | Admitting: Anesthesiology

## 2023-01-24 DIAGNOSIS — G8929 Other chronic pain: Secondary | ICD-10-CM | POA: Insufficient documentation

## 2023-01-24 DIAGNOSIS — F17210 Nicotine dependence, cigarettes, uncomplicated: Secondary | ICD-10-CM | POA: Diagnosis not present

## 2023-01-24 DIAGNOSIS — F319 Bipolar disorder, unspecified: Secondary | ICD-10-CM | POA: Diagnosis not present

## 2023-01-24 DIAGNOSIS — X501XXA Overexertion from prolonged static or awkward postures, initial encounter: Secondary | ICD-10-CM | POA: Insufficient documentation

## 2023-01-24 DIAGNOSIS — S83511A Sprain of anterior cruciate ligament of right knee, initial encounter: Secondary | ICD-10-CM

## 2023-01-24 DIAGNOSIS — S83512A Sprain of anterior cruciate ligament of left knee, initial encounter: Secondary | ICD-10-CM | POA: Diagnosis present

## 2023-01-24 DIAGNOSIS — Z01818 Encounter for other preprocedural examination: Secondary | ICD-10-CM

## 2023-01-24 HISTORY — PX: ANTERIOR CRUCIATE LIGAMENT REPAIR: SHX115

## 2023-01-24 SURGERY — RECONSTRUCTION, KNEE, ACL, USING HAMSTRING GRAFT
Anesthesia: Regional | Site: Knee | Laterality: Left

## 2023-01-24 MED ORDER — LIDOCAINE 2% (20 MG/ML) 5 ML SYRINGE
INTRAMUSCULAR | Status: DC | PRN
  Administered 2023-01-24: 60 mg via INTRAVENOUS

## 2023-01-24 MED ORDER — BUPIVACAINE HCL (PF) 0.25 % IJ SOLN
INTRAMUSCULAR | Status: AC
  Filled 2023-01-24: qty 30

## 2023-01-24 MED ORDER — BUPIVACAINE-EPINEPHRINE 0.25% -1:200000 IJ SOLN
INTRAMUSCULAR | Status: DC | PRN
  Administered 2023-01-24: 10 mL

## 2023-01-24 MED ORDER — SUGAMMADEX SODIUM 200 MG/2ML IV SOLN
INTRAVENOUS | Status: DC | PRN
  Administered 2023-01-24: 200 mg via INTRAVENOUS

## 2023-01-24 MED ORDER — MIDAZOLAM HCL 2 MG/2ML IJ SOLN
INTRAMUSCULAR | Status: AC
Start: 2023-01-24 — End: ?
  Filled 2023-01-24: qty 2

## 2023-01-24 MED ORDER — TRANEXAMIC ACID-NACL 1000-0.7 MG/100ML-% IV SOLN
1000.0000 mg | INTRAVENOUS | Status: AC
  Administered 2023-01-24: 1000 mg via INTRAVENOUS

## 2023-01-24 MED ORDER — MIDAZOLAM HCL 2 MG/2ML IJ SOLN: 2.0000 mg | Freq: Once | INTRAMUSCULAR | Status: AC

## 2023-01-24 MED ORDER — PHENYLEPHRINE HCL-NACL 20-0.9 MG/250ML-% IV SOLN
INTRAVENOUS | Status: DC | PRN
  Administered 2023-01-24: 160 ug via INTRAVENOUS
  Administered 2023-01-24: 80 ug via INTRAVENOUS
  Administered 2023-01-24: 160 ug via INTRAVENOUS
  Administered 2023-01-24: 30 ug/min via INTRAVENOUS
  Administered 2023-01-24: 80 ug via INTRAVENOUS

## 2023-01-24 MED ORDER — MELOXICAM 15 MG PO TABS
15.0000 mg | ORAL_TABLET | Freq: Every day | ORAL | 2 refills | Status: DC
  Filled 2023-01-24: qty 30, 30d supply, fill #0

## 2023-01-24 MED ORDER — SODIUM CHLORIDE 0.9 % IR SOLN
Status: DC | PRN
  Administered 2023-01-24: 3000 mL

## 2023-01-24 MED ORDER — PROPOFOL 10 MG/ML IV BOLUS
INTRAVENOUS | Status: DC | PRN
  Administered 2023-01-24: 150 mg via INTRAVENOUS
  Administered 2023-01-24: 50 mg via INTRAVENOUS

## 2023-01-24 MED ORDER — ASPIRIN 81 MG PO CHEW
81.0000 mg | CHEWABLE_TABLET | Freq: Two times a day (BID) | ORAL | 0 refills | Status: AC
Start: 2023-01-24 — End: 2023-02-23
  Filled 2023-01-24: qty 60, 30d supply, fill #0

## 2023-01-24 MED ORDER — ORAL CARE MOUTH RINSE
15.0000 mL | Freq: Once | OROMUCOSAL | Status: AC
Start: 2023-01-24 — End: 2023-01-24

## 2023-01-24 MED ORDER — SODIUM CHLORIDE 0.9 % IR SOLN
Status: DC | PRN
  Administered 2023-01-24 (×4): 3001 mL

## 2023-01-24 MED ORDER — OXYCODONE HCL 5 MG PO TABS
5.0000 mg | ORAL_TABLET | Freq: Once | ORAL | Status: AC | PRN
  Administered 2023-01-24: 5 mg via ORAL

## 2023-01-24 MED ORDER — VANCOMYCIN HCL 1000 MG IV SOLR
INTRAVENOUS | Status: AC
  Filled 2023-01-24: qty 20

## 2023-01-24 MED ORDER — KETAMINE HCL 50 MG/5ML IJ SOSY
PREFILLED_SYRINGE | INTRAMUSCULAR | Status: AC
  Filled 2023-01-24: qty 5

## 2023-01-24 MED ORDER — OXYCODONE HCL 5 MG/5ML PO SOLN: 5.0000 mg | Freq: Once | ORAL | Status: AC | PRN

## 2023-01-24 MED ORDER — PROPOFOL 10 MG/ML IV BOLUS
INTRAVENOUS | Status: AC
  Filled 2023-01-24: qty 20

## 2023-01-24 MED ORDER — ACETAMINOPHEN 500 MG PO TABS
ORAL_TABLET | ORAL | Status: AC
  Administered 2023-01-24: 1000 mg via ORAL
  Filled 2023-01-24: qty 2

## 2023-01-24 MED ORDER — MORPHINE SULFATE (PF) 4 MG/ML IV SOLN
INTRAVENOUS | Status: AC
  Filled 2023-01-24: qty 2

## 2023-01-24 MED ORDER — LACTATED RINGERS IV SOLN: INTRAVENOUS | Status: DC | PRN

## 2023-01-24 MED ORDER — CHLORHEXIDINE GLUCONATE 0.12 % MT SOLN: 15.0000 mL | Freq: Once | OROMUCOSAL | Status: AC

## 2023-01-24 MED ORDER — OXYCODONE HCL 5 MG PO TABS
ORAL_TABLET | ORAL | Status: AC
  Filled 2023-01-24: qty 1

## 2023-01-24 MED ORDER — BUPIVACAINE-EPINEPHRINE (PF) 0.25% -1:200000 IJ SOLN
INTRAMUSCULAR | Status: AC
  Filled 2023-01-24: qty 30

## 2023-01-24 MED ORDER — FENTANYL CITRATE (PF) 100 MCG/2ML IJ SOLN
25.0000 ug | INTRAMUSCULAR | Status: DC | PRN
  Administered 2023-01-24 (×2): 25 ug via INTRAVENOUS
  Administered 2023-01-24: 50 ug via INTRAVENOUS

## 2023-01-24 MED ORDER — FENTANYL CITRATE (PF) 100 MCG/2ML IJ SOLN: 50.0000 ug | Freq: Once | INTRAMUSCULAR | Status: AC

## 2023-01-24 MED ORDER — CLONIDINE HCL (ANALGESIA) 100 MCG/ML EP SOLN
EPIDURAL | Status: AC
  Filled 2023-01-24: qty 10

## 2023-01-24 MED ORDER — MIDAZOLAM HCL 2 MG/2ML IJ SOLN
INTRAMUSCULAR | Status: AC
  Administered 2023-01-24: 2 mg via INTRAVENOUS
  Filled 2023-01-24: qty 2

## 2023-01-24 MED ORDER — FENTANYL CITRATE (PF) 250 MCG/5ML IJ SOLN
INTRAMUSCULAR | Status: AC
  Filled 2023-01-24: qty 5

## 2023-01-24 MED ORDER — KETAMINE HCL 10 MG/ML IJ SOLN
INTRAMUSCULAR | Status: DC | PRN
  Administered 2023-01-24: 20 mg via INTRAVENOUS
  Administered 2023-01-24: 30 mg via INTRAVENOUS

## 2023-01-24 MED ORDER — ACETAMINOPHEN 500 MG PO TABS: 1000.0000 mg | ORAL_TABLET | Freq: Once | ORAL | Status: AC

## 2023-01-24 MED ORDER — OXYCODONE HCL 5 MG PO TABS
5.0000 mg | ORAL_TABLET | ORAL | 0 refills | Status: DC | PRN
  Filled 2023-01-24: qty 30, 5d supply, fill #0

## 2023-01-24 MED ORDER — EPHEDRINE SULFATE-NACL 50-0.9 MG/10ML-% IV SOSY
PREFILLED_SYRINGE | INTRAVENOUS | Status: DC | PRN
  Administered 2023-01-24 (×2): 10 mg via INTRAVENOUS
  Administered 2023-01-24: 5 mg via INTRAVENOUS

## 2023-01-24 MED ORDER — DEXAMETHASONE SODIUM PHOSPHATE 10 MG/ML IJ SOLN
INTRAMUSCULAR | Status: DC | PRN
  Administered 2023-01-24: 10 mg via INTRAVENOUS

## 2023-01-24 MED ORDER — POVIDONE-IODINE 10 % EX SWAB: 2.0000 | Freq: Once | CUTANEOUS | Status: DC

## 2023-01-24 MED ORDER — SODIUM CHLORIDE 0.9 % IV SOLN: INTRAVENOUS | Status: DC

## 2023-01-24 MED ORDER — METHOCARBAMOL 500 MG PO TABS
500.0000 mg | ORAL_TABLET | Freq: Three times a day (TID) | ORAL | 1 refills | Status: AC | PRN
Start: 2023-01-24 — End: ?
  Filled 2023-01-24: qty 30, 10d supply, fill #0

## 2023-01-24 MED ORDER — FENTANYL CITRATE (PF) 100 MCG/2ML IJ SOLN
INTRAMUSCULAR | Status: AC
  Filled 2023-01-24: qty 2

## 2023-01-24 MED ORDER — ONDANSETRON HCL 4 MG/2ML IJ SOLN
INTRAMUSCULAR | Status: DC | PRN
  Administered 2023-01-24: 4 mg via INTRAVENOUS

## 2023-01-24 MED ORDER — CEFAZOLIN SODIUM-DEXTROSE 2-4 GM/100ML-% IV SOLN
2.0000 g | INTRAVENOUS | Status: AC
  Administered 2023-01-24: 2 g via INTRAVENOUS

## 2023-01-24 MED ORDER — VANCOMYCIN HCL 1000 MG IV SOLR
INTRAVENOUS | Status: DC | PRN
  Administered 2023-01-24: 1000 mg

## 2023-01-24 MED ORDER — POVIDONE-IODINE 10 % EX SWAB
2.0000 | Freq: Once | CUTANEOUS | Status: AC
  Administered 2023-01-24: 2 via TOPICAL

## 2023-01-24 MED ORDER — ALBUTEROL SULFATE HFA 108 (90 BASE) MCG/ACT IN AERS
INHALATION_SPRAY | RESPIRATORY_TRACT | Status: DC | PRN
  Administered 2023-01-24: 4 via RESPIRATORY_TRACT
  Administered 2023-01-24: 6 via RESPIRATORY_TRACT

## 2023-01-24 MED ORDER — CHLORHEXIDINE GLUCONATE 0.12 % MT SOLN
OROMUCOSAL | Status: AC
  Administered 2023-01-24: 15 mL via OROMUCOSAL
  Filled 2023-01-24: qty 15

## 2023-01-24 MED ORDER — CEFAZOLIN SODIUM-DEXTROSE 2-4 GM/100ML-% IV SOLN
INTRAVENOUS | Status: AC
  Filled 2023-01-24: qty 100

## 2023-01-24 MED ORDER — POVIDONE-IODINE 7.5 % EX SOLN
Freq: Once | CUTANEOUS | Status: DC
  Filled 2023-01-24: qty 118

## 2023-01-24 MED ORDER — TRANEXAMIC ACID-NACL 1000-0.7 MG/100ML-% IV SOLN
INTRAVENOUS | Status: AC
  Filled 2023-01-24: qty 100

## 2023-01-24 MED ORDER — EPINEPHRINE PF 1 MG/ML IJ SOLN
INTRAMUSCULAR | Status: AC
  Filled 2023-01-24: qty 4

## 2023-01-24 MED ORDER — ROCURONIUM BROMIDE 10 MG/ML (PF) SYRINGE
PREFILLED_SYRINGE | INTRAVENOUS | Status: DC | PRN
  Administered 2023-01-24: 20 mg via INTRAVENOUS
  Administered 2023-01-24: 50 mg via INTRAVENOUS

## 2023-01-24 MED ORDER — FENTANYL CITRATE (PF) 250 MCG/5ML IJ SOLN
INTRAMUSCULAR | Status: DC | PRN
  Administered 2023-01-24 (×3): 50 ug via INTRAVENOUS

## 2023-01-24 MED ORDER — FENTANYL CITRATE (PF) 100 MCG/2ML IJ SOLN
INTRAMUSCULAR | Status: AC
  Administered 2023-01-24: 50 ug via INTRAVENOUS
  Filled 2023-01-24: qty 2

## 2023-01-24 MED ORDER — MORPHINE SULFATE 4 MG/ML IJ SOLN
INTRAMUSCULAR | Status: DC | PRN
  Administered 2023-01-24: 23 mL

## 2023-01-24 MED ORDER — 0.9 % SODIUM CHLORIDE (POUR BTL) OPTIME
TOPICAL | Status: DC | PRN
  Administered 2023-01-24: 1000 mL

## 2023-01-24 SURGICAL SUPPLY — 77 items
ALCOHOL 70% 16 OZ (MISCELLANEOUS) ×2 IMPLANT
ANCHOR AUTOGRAFT GRAFTLINK CP2 (Anchor) IMPLANT
BAG COUNTER SPONGE SURGICOUNT (BAG) ×2 IMPLANT
BANDAGE ESMARK 6X9 LF (GAUZE/BANDAGES/DRESSINGS) IMPLANT
BLADE SURG 10 STRL SS (BLADE) ×2 IMPLANT
BLADE SURG 15 STRL LF DISP TIS (BLADE) ×4 IMPLANT
BNDG ELASTIC 6X15 VLCR STRL LF (GAUZE/BANDAGES/DRESSINGS) ×2 IMPLANT
BNDG ESMARK 6X9 LF (GAUZE/BANDAGES/DRESSINGS) ×1
BURR OVAL 8 FLU 4.0X13 (MISCELLANEOUS) IMPLANT
COVER MAYO STAND STRL (DRAPES) ×2 IMPLANT
COVER SURGICAL LIGHT HANDLE (MISCELLANEOUS) ×2 IMPLANT
CUFF TRNQT CYL 34X4.125X (TOURNIQUET CUFF) IMPLANT
DISSECTOR 4.0MM X 13CM (MISCELLANEOUS) ×2 IMPLANT
DRAPE ARTHROSCOPY W/POUCH 114 (DRAPES) ×2 IMPLANT
DRAPE INCISE IOBAN 66X45 STRL (DRAPES) ×2 IMPLANT
DRAPE OEC MINIVIEW 54X84 (DRAPES) IMPLANT
DRAPE SURG ORHT 6 SPLT 77X108 (DRAPES) ×2 IMPLANT
DRAPE U-SHAPE 47X51 STRL (DRAPES) ×2 IMPLANT
DRILL FLIPCUTTER III 6-12 (ORTHOPEDIC DISPOSABLE SUPPLIES) IMPLANT
DRSG TEGADERM 4X4.75 (GAUZE/BANDAGES/DRESSINGS) ×8 IMPLANT
DRSG XEROFORM 1X8 (GAUZE/BANDAGES/DRESSINGS) IMPLANT
DURAPREP 26ML APPLICATOR (WOUND CARE) ×4 IMPLANT
DW OUTFLOW CASSETTE/TUBE SET (MISCELLANEOUS) ×2 IMPLANT
ELECT REM PT RETURN 9FT ADLT (ELECTROSURGICAL) ×1
ELECTRODE REM PT RTRN 9FT ADLT (ELECTROSURGICAL) ×2 IMPLANT
FLIPCUTTER III 6-12 AR-1204FF (ORTHOPEDIC DISPOSABLE SUPPLIES)
GAUZE PAD ABD 8X10 STRL (GAUZE/BANDAGES/DRESSINGS) ×2 IMPLANT
GAUZE SPONGE 4X4 12PLY STRL (GAUZE/BANDAGES/DRESSINGS) IMPLANT
GAUZE SPONGE 4X4 12PLY STRL LF (GAUZE/BANDAGES/DRESSINGS) ×2 IMPLANT
GAUZE XEROFORM 1X8 LF (GAUZE/BANDAGES/DRESSINGS) ×4 IMPLANT
GLOVE BIOGEL PI IND STRL 8 (GLOVE) ×2 IMPLANT
GLOVE ECLIPSE 8.0 STRL XLNG CF (GLOVE) ×2 IMPLANT
GLOVE ORTHO TXT STRL SZ7.5 (GLOVE) ×2 IMPLANT
GOWN STRL REUS W/ TWL LRG LVL3 (GOWN DISPOSABLE) ×6 IMPLANT
IMMOBILIZER KNEE 22 UNIV (SOFTGOODS) ×2 IMPLANT
IMP SYS 2ND FIX PEEK 4.75X19.1 (Miscellaneous) ×2 IMPLANT
IMPL SYS 2ND FX PEEK 4.75X19.1 (Miscellaneous) IMPLANT
KIT BASIN OR (CUSTOM PROCEDURE TRAY) ×2 IMPLANT
KIT BIOCARTILAGE DEL W/SYRINGE (KITS) IMPLANT
KIT BIOCARTILAGE LG JOINT MIX (KITS) ×2 IMPLANT
KIT BUTTON TIGHTROPE ABS 8X12 (Anchor) IMPLANT
KIT TURNOVER KIT B (KITS) ×2 IMPLANT
MANIFOLD NEPTUNE II (INSTRUMENTS) ×2 IMPLANT
NDL 18GX1X1/2 (RX/OR ONLY) (NEEDLE) ×2 IMPLANT
NDL HYPO 18GX1.5 BLUNT FILL (NEEDLE) ×2 IMPLANT
NDL HYPO 21X1.5 SAFETY (NEEDLE) IMPLANT
NEEDLE 18GX1X1/2 (RX/OR ONLY) (NEEDLE) ×1 IMPLANT
NEEDLE HYPO 18GX1.5 BLUNT FILL (NEEDLE) ×1 IMPLANT
NEEDLE HYPO 21X1.5 SAFETY (NEEDLE) ×1 IMPLANT
NS IRRIG 1000ML POUR BTL (IV SOLUTION) ×2 IMPLANT
PACK ARTHROSCOPY DSU (CUSTOM PROCEDURE TRAY) ×2 IMPLANT
PAD ARMBOARD 7.5X6 YLW CONV (MISCELLANEOUS) ×4 IMPLANT
PAD CAST 4YDX4 CTTN HI CHSV (CAST SUPPLIES) ×2 IMPLANT
PADDING CAST COTTON 6X4 STRL (CAST SUPPLIES) ×4 IMPLANT
PENCIL BUTTON HOLSTER BLD 10FT (ELECTRODE) ×2 IMPLANT
PUTTY DBM ALLOSYNC PURE 5CC (Putty) IMPLANT
SPIKE FLUID TRANSFER (MISCELLANEOUS) ×2 IMPLANT
SPONGE T-LAP 18X18 ~~LOC~~+RFID (SPONGE) ×2 IMPLANT
SPONGE T-LAP 4X18 ~~LOC~~+RFID (SPONGE) ×4 IMPLANT
STRIP CLOSURE SKIN 1/2X4 (GAUZE/BANDAGES/DRESSINGS) ×2 IMPLANT
SUCTION TUBE FRAZIER 10FR DISP (SUCTIONS) ×2 IMPLANT
SUT ETHILON 3 0 PS 1 (SUTURE) ×4 IMPLANT
SUT MNCRL AB 3-0 PS2 18 (SUTURE) ×2 IMPLANT
SUT VIC AB 0 CT1 27XBRD ANBCTR (SUTURE) ×2 IMPLANT
SUT VIC AB 2-0 CT1 TAPERPNT 27 (SUTURE) ×2 IMPLANT
SUT VICRYL 0 UR6 27IN ABS (SUTURE) ×2 IMPLANT
SYR 30ML LL (SYRINGE) ×2 IMPLANT
SYR 3ML LL SCALE MARK (SYRINGE) ×2 IMPLANT
SYR BULB IRRIG 60ML STRL (SYRINGE) ×2 IMPLANT
SYR TB 1ML LUER SLIP (SYRINGE) ×2 IMPLANT
TOWEL GREEN STERILE (TOWEL DISPOSABLE) ×2 IMPLANT
TOWEL GREEN STERILE FF (TOWEL DISPOSABLE) ×2 IMPLANT
TUBING ARTHROSCOPY IRRIG 16FT (MISCELLANEOUS) ×2 IMPLANT
UNDERPAD 30X36 HEAVY ABSORB (UNDERPADS AND DIAPERS) ×2 IMPLANT
WAND ABLATOR APOLLO I90 (BUR) IMPLANT
WRAP KNEE MAXI GEL POST OP (GAUZE/BANDAGES/DRESSINGS) ×2 IMPLANT
YANKAUER SUCT BULB TIP NO VENT (SUCTIONS) ×2 IMPLANT

## 2023-01-24 NOTE — Brief Op Note (Signed)
   01/24/2023  10:59 AM  PATIENT:  Lamar PARAS Chiles  48 y.o. male  PRE-OPERATIVE DIAGNOSIS:  left knee anterior cruciate ligament tear, possible meniscal tear  POST-OPERATIVE DIAGNOSIS:  left knee anterior cruciate ligament tear,   PROCEDURE:  Procedure(s): LEFT KNEE ANTERIOR CRUCIATE LIGAMENT RECONSTRUCTION HAMSTRING GRAFT  SURGEON:  Surgeon(s): Addie Cordella Hamilton, MD  ASSISTANT: magnant pa  ANESTHESIA:   general  EBL: 10 ml    Total I/O In: 1000 [I.V.:1000] Out: 75 [Blood:75]  BLOOD ADMINISTERED: none  DRAINS: none   LOCAL MEDICATIONS USED: Marcaine  morphine  clonidine   SPECIMEN:  No Specimen  COUNTS:  YES  TOURNIQUET:  * Missing tourniquet times found for documented tourniquets in log: [id number] *  DICTATION: .done.  PLAN OF CARE: Discharge to home after PACU  PATIENT DISPOSITION:  PACU - hemodynamically stable

## 2023-01-24 NOTE — Transfer of Care (Signed)
 Immediate Anesthesia Transfer of Care Note  Patient: Craig [name]  Procedure(s) Performed: LEFT KNEE ANTERIOR CRUCIATE LIGAMENT RECONSTRUCTION HAMSTRING GRAFT (Left: Knee)  Patient Location: PACU  Anesthesia Type:General and Regional  Level of Consciousness: awake and sedated  Airway & Oxygen  Therapy: Patient Spontanous Breathing and Patient connected to face mask oxygen   Post-op Assessment: Report given to RN and Post -op Vital signs reviewed and stable  Post vital signs: Reviewed and stable  Last Vitals:  Vitals Value Taken Time  BP 121/75 01/24/23 1031  Temp    Pulse 80 01/24/23 1033  Resp 20 01/24/23 1033  SpO2 96 % 01/24/23 1033  Vitals shown include unfiled device data.  Last Pain:  Vitals:   01/24/23 0618  TempSrc:   PainSc: 0-No pain         Complications: No notable events documented.

## 2023-01-24 NOTE — Op Note (Signed)
 [name], [name] MEDICAL RECORD NO: [id number] ACCOUNT NO: [id number] DATE OF BIRTH: [birth year] FACILITY: [facility] LOCATION: [facility] PHYSICIAN: Cordella RAMAN. Addie, MD  Operative Report   DATE OF PROCEDURE: 01/24/2023  PREOPERATIVE DIAGNOSIS:  Left knee ACL tear.  POSTOPERATIVE DIAGNOSIS:  Left knee ACL tear.  PROCEDURE:  Left knee ACL reconstruction using hamstring autograft and Arthrex dual EndoButton fixation.  SURGEON:  Cordella RAMAN. Addie, MD  ASSISTANT:  Herlene Calix, PA.  INDICATIONS:  This is a 48 year old patient with left knee pain and instability following injury several months ago.  MRI scan shows ACL tear.  He had right knee done 48 years ago and did well with that.  He presents now for operative management of  symptomatic instability in the left knee from ACL tearing.  DESCRIPTION OF PROCEDURE:  The patient was brought to the operating room where general endotracheal anesthesia was induced.  Preoperative antibiotics were administered.  Timeout was called.  The left leg was prescrubbed with alcohol  and Betadine  allowed  to air dry, prepped with DuraPrep solution and draped in a sterile manner.  Preop examination under anesthesia demonstrated full extension, full flexion, good stability to varus and valgus stress at 0 and 30 degrees.  No posterolateral rotatory  instability.  PCL intact.  ACL out with 5 mm anterior drawer and Lachman with soft endpoint.  The patient's leg was then sterilely prepped and draped.  After pre-scrubbing with alcohol  and Betadine , which was allowed to air dry, then the leg was prepped  with DuraPrep solution and draped in a sterile manner.  Ioban was used to cover the operative field.  Timeout was called.  The portals were then anesthetized anterior inferomedial and anterior inferolateral with 5 mL of Marcaine  with epinephrine .  Next,  an incision was made just over the pes tendons between the posterior crest and anterior crest of the tibia on the medial  side.  Skin and subcutaneous tissue were sharply divided.  The semitendinosus and gracilis tendons were then harvested.  Care was  taken to avoid injury to the saphenous nerve.  Hamstrings were harvested and prepared on the back table using dual EndoButton technique by Herlene.  Overall, we achieved a 9.5 mm graft and this graft was soaked with vancomycin  sponge.  Next, this incision  was thoroughly irrigated and a moist sponge was placed.  The anterior inferolateral portal was created.  Anterior inferomedial portal was created under direct visualization.  Diagnostic arthroscopy demonstrated intact patellofemoral compartment.  No  loose bodies in the medial or lateral gutter.  No medial meniscal tear or lateral meniscal tear and the articular surfaces were intact.  The ACL was debrided.  Notchplasty was performed.  Synovium was removed from the anterior part of the knee for  visualization.  Next, an after notchplasty was performed over-the-top position was identified.  At this time, graft preparation was complete.  With the guide in position at approximately the 3 o'clock position on that lateral femoral condyle, FlipCutter  was used to drill a 9.5 mm tunnel, which had about a 1 mm back wall.  Next, a point-to-point guide was used to drill a tunnel in the posterior aspect of the ACL footprint.  This was drilled also using the FlipCutter.  Sutures were placed through these  tunnels and the graft was then passed into the femoral tunnel after bone grafting performed in both tunnels.  Arthrex EndoButton flipping was confirmed under fluoroscopy and then we pulled the graft into the tunnel, which had been  filled with bone graft.   Next, the tibial socket was filled with the graft and that was taken through a range of motion of 20 cycles.  That tunnel had also been filled with graft prior to placement of the ACL into the tibial tunnel.  All in all, very snug fits were present.   The EndoButton fixation was performed  with the leg in extension.  Internal bracing was also utilized and that was incorporated into the repair and internal braces were also placed into a SwiveLock just below the tibial tunnel.  Very stable fixation was  achieved in both extension and flexion.  Graft looked very good under arthroscopic evaluation.  The joint was then thoroughly irrigated.  Instruments were removed.  The harvest site was also irrigated and portals were then closed using 2-0 Vicryl and 3-0  nylon.  The harvest incision was closed using #1 Vicryl suture followed by 2-0 Vicryl suture and 3-0 Monocryl with Steri-Strips applied.  Impervious dressings also applied.  A solution of Marcaine , morphine , and clonidine  injected into the knee for  postop pain relief.  The patient was transferred to the recovery room with the knee in a knee immobilizer and an Ace wrap.  He tolerated the procedure well.  Craig [name]'s assistance was required at all times for retraction, opening, closing, and graft preparation.  His assistance was medical necessity.   PUS D: 01/24/2023 11:07:56 am T: 01/24/2023 11:33:00 am  JOB: [id number]/ [id number]
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
## 2023-01-24 NOTE — Anesthesia Procedure Notes (Addendum)
 Procedure Name: Intubation Date/Time: 01/24/2023 7:39 AM  Performed by: Cindie Ronal POUR, CRNAPre-anesthesia Checklist: Patient identified, Emergency Drugs available, Suction available and Patient being monitored Patient Re-evaluated:Patient Re-evaluated prior to induction Oxygen  Delivery Method: Circle System Utilized Preoxygenation: Pre-oxygenation with 100% oxygen  Induction Type: IV induction Ventilation: Mask ventilation without difficulty and Oral airway inserted - appropriate to patient size Laryngoscope Size: Cleotilde and 2 Grade View: Grade I Tube type: Oral Tube size: 7.5 mm Number of attempts: 1 Airway Equipment and Method: Stylet and Oral airway Placement Confirmation: ETT inserted through vocal cords under direct vision, positive ETCO2 and breath sounds checked- equal and bilateral Secured at: 24 cm Tube secured with: Tape Dental Injury: Teeth and Oropharynx as per pre-operative assessment

## 2023-01-25 ENCOUNTER — Encounter (HOSPITAL_COMMUNITY): Payer: Self-pay | Admitting: Orthopedic Surgery

## 2023-01-25 ENCOUNTER — Telehealth: Payer: Self-pay

## 2023-01-25 MED ORDER — DEXAMETHASONE SODIUM PHOSPHATE 10 MG/ML IJ SOLN
INTRAMUSCULAR | Status: DC | PRN
  Administered 2023-01-24: 10 mg

## 2023-01-25 MED ORDER — ROPIVACAINE HCL 5 MG/ML IJ SOLN
INTRAMUSCULAR | Status: DC | PRN
  Administered 2023-01-24: 20 mL via PERINEURAL

## 2023-01-25 NOTE — Anesthesia Procedure Notes (Signed)
 Anesthesia Regional Block: Adductor canal block   Pre-Anesthetic Checklist: , timeout performed,  Correct Patient, Correct Site, Correct Laterality,  Correct Procedure, Correct Position, site marked,  Risks and benefits discussed,  Pre-op evaluation,  At surgeon's request and post-op pain management  Laterality: Left  Prep: Maximum Sterile Barrier Precautions used, chloraprep       Needles:  Injection technique: Single-shot  Needle Type: Echogenic Stimulator Needle     Needle Length: 9cm  Needle Gauge: 21     Additional Needles:   Procedures:,,,, ultrasound used (permanent image in chart),,    Narrative:  Start time: 01/24/2023 7:12 AM End time: 01/24/2023 7:15 AM Injection made incrementally with aspirations every 5 mL. Anesthesiologist: Niels Marien CROME, MD

## 2023-01-25 NOTE — Anesthesia Postprocedure Evaluation (Signed)
 Anesthesia Post Note  Patient: Craig [name]  Procedure(s) Performed: LEFT KNEE ANTERIOR CRUCIATE LIGAMENT RECONSTRUCTION HAMSTRING GRAFT (Left: Knee)     Patient location during evaluation: PACU Anesthesia Type: Regional and General Level of consciousness: awake and alert Pain management: pain level controlled Vital Signs Assessment: post-procedure vital signs reviewed and stable Respiratory status: spontaneous breathing, nonlabored ventilation, respiratory function stable and patient connected to nasal cannula oxygen  Cardiovascular status: blood pressure returned to baseline and stable Postop Assessment: no apparent nausea or vomiting Anesthetic complications: no  No notable events documented.  Last Vitals:  Vitals:   01/24/23 1145 01/24/23 1200  BP: 110/74 108/74  Pulse: 85 84  Resp: 15 15  Temp:  (!) 36.4 C  SpO2: 98% 98%    Last Pain:  Vitals:   01/24/23 1200  TempSrc:   PainSc: Asleep                 Analeigh Aries L Isaah Furry

## 2023-01-25 NOTE — Telephone Encounter (Signed)
 Patient called concerning bandage on left knee from surgery.

## 2023-01-30 ENCOUNTER — Telehealth: Payer: Self-pay | Admitting: Orthopedic Surgery

## 2023-01-30 DIAGNOSIS — S83511A Sprain of anterior cruciate ligament of right knee, initial encounter: Secondary | ICD-10-CM

## 2023-01-30 NOTE — Telephone Encounter (Signed)
 Pt called requesting a call back concerning if he can take his gauzes off. Please call about this matter at [phone].

## 2023-01-30 NOTE — Telephone Encounter (Signed)
 IC spoke with patient and advised.

## 2023-01-30 NOTE — Telephone Encounter (Signed)
 I would leave them on until his appt on Wednesday so he can continue showering to keep the incisions waterproof

## 2023-02-01 ENCOUNTER — Encounter: Payer: Self-pay | Admitting: Surgical

## 2023-02-01 ENCOUNTER — Ambulatory Visit (INDEPENDENT_AMBULATORY_CARE_PROVIDER_SITE_OTHER): Payer: Medicare Other | Admitting: Surgical

## 2023-02-01 DIAGNOSIS — Z9889 Other specified postprocedural states: Secondary | ICD-10-CM
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
## 2023-02-01 NOTE — Addendum Note (Signed)
 Addended by: Chas Axel on: 02/01/2023 01:28 PM   Modules accepted: Orders

## 2023-02-03 ENCOUNTER — Telehealth: Payer: Self-pay | Admitting: Orthopedic Surgery

## 2023-02-03 NOTE — Telephone Encounter (Signed)
I would leave those on until they start to fall off by themselves. The bruising is normal after surgery, even this far out

## 2023-02-03 NOTE — Telephone Encounter (Signed)
Patient called. Would like to know if he can take the bandages off. Says it is bruised. His cb# [phone]

## 2023-02-03 NOTE — Telephone Encounter (Signed)
IC spoke with patient and advised. He verbalized understanding.

## 2023-02-06 ENCOUNTER — Other Ambulatory Visit: Payer: Self-pay

## 2023-02-06 ENCOUNTER — Ambulatory Visit (HOSPITAL_COMMUNITY): Payer: Medicare Other | Attending: Surgical

## 2023-02-06 DIAGNOSIS — R262 Difficulty in walking, not elsewhere classified: Secondary | ICD-10-CM

## 2023-02-06 DIAGNOSIS — M6281 Muscle weakness (generalized): Secondary | ICD-10-CM

## 2023-02-06 DIAGNOSIS — Z9889 Other specified postprocedural states: Secondary | ICD-10-CM | POA: Insufficient documentation

## 2023-02-06 DIAGNOSIS — M25562 Pain in left knee: Secondary | ICD-10-CM
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
## 2023-02-08 ENCOUNTER — Ambulatory Visit (HOSPITAL_COMMUNITY): Payer: Medicare Other | Admitting: Physical Therapy

## 2023-02-08 DIAGNOSIS — R262 Difficulty in walking, not elsewhere classified: Secondary | ICD-10-CM

## 2023-02-08 DIAGNOSIS — M6281 Muscle weakness (generalized): Secondary | ICD-10-CM

## 2023-02-08 DIAGNOSIS — M25562 Pain in left knee: Secondary | ICD-10-CM
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
## 2023-02-10 ENCOUNTER — Encounter (HOSPITAL_COMMUNITY): Payer: Medicare Other

## 2023-02-10 ENCOUNTER — Telehealth: Payer: Self-pay | Admitting: Orthopedic Surgery

## 2023-02-10 NOTE — Telephone Encounter (Signed)
Patient called. Says he can not put weight on his leg. Is using 2 crouches. Is this normal? He would like a call. [phone]

## 2023-02-10 NOTE — Telephone Encounter (Signed)
I called patient.  He is having increased knee pain and medial thigh pain without groin pain since his therapy session on Wednesday.  Based on his description it sounds most like postworkout soreness.  He has no chest pain or shortness of breath or calf pain.  Recommended he continue taking aspirin once a day.  I did recommend getting an ultrasound today at an outpatient clinic or the ER but he would like to hold off until he can get a ride with someone tomorrow so tomorrow he will go to the ER to get ultrasound done unless his symptoms resolved.  If his symptoms worsen in the meantime, recommended that he call ambulance to get transport to ER.  He will call the office on-call number with any further concerns.

## 2023-02-11 ENCOUNTER — Encounter (HOSPITAL_COMMUNITY): Payer: Self-pay

## 2023-02-11 ENCOUNTER — Emergency Department (HOSPITAL_COMMUNITY)
Admission: EM | Admit: 2023-02-11 | Discharge: 2023-02-11 | Disposition: A | Discharge status: Home / Self Care | Payer: Medicare Other

## 2023-02-11 ENCOUNTER — Emergency Department (HOSPITAL_COMMUNITY): Payer: Medicare Other

## 2023-02-11 ENCOUNTER — Other Ambulatory Visit: Payer: Self-pay

## 2023-02-11 DIAGNOSIS — Z7982 Long term (current) use of aspirin: Secondary | ICD-10-CM | POA: Diagnosis not present

## 2023-02-11 DIAGNOSIS — M79605 Pain in left leg: Secondary | ICD-10-CM | POA: Insufficient documentation

## 2023-02-11 DIAGNOSIS — M7989 Other specified soft tissue disorders: Secondary | ICD-10-CM | POA: Diagnosis not present

## 2023-02-11 NOTE — ED Triage Notes (Signed)
Pt arrived via pOV from home c/o left leg pain. Pt reports he spoke to his PCP office and they recommended him to the ER for an Ultrasound study to rule out DVT. Pt ambulatory in Triage with crutches.

## 2023-02-11 NOTE — ED Provider Notes (Signed)
[place] EMERGENCY DEPARTMENT AT [facility] Provider Note   CSN: [id number] Arrival date & time: 02/11/23  1152     History  Chief Complaint  Patient presents with   Leg Pain    CALLIN ASHE is a 48 y.o. male.  This is a 48 year old male present emergency department with left leg pain and swelling.  Had left knee surgery on the seventh for emergency surgery   Leg Pain      Home Medications Prior to Admission medications   Medication Sig Start Date End Date Taking? Authorizing Provider  aspirin (ASPIRIN CHILDRENS) 81 MG chewable tablet Chew 1 tablet (81 mg total) by mouth 2 (two) times daily. To prevent blood clots 01/24/23 02/23/23  Magnant, Joycie Peek, PA-C  meloxicam (MOBIC) 15 MG tablet Take 1 tablet (15 mg total) by mouth daily. 01/24/23 01/24/24  Magnant, Joycie Peek, PA-C  methocarbamol (ROBAXIN) 500 MG tablet Take 1 tablet (500 mg total) by mouth every 8 (eight) hours as needed for muscle spasms. 01/24/23   Magnant, Charles L, PA-C  oxyCODONE (ROXICODONE) 5 MG immediate release tablet Take 1 tablet (5 mg total) by mouth every 4 (four) hours as needed for severe pain (pain score 7-10). 01/24/23   Magnant, Joycie Peek, PA-C      Allergies    Penicillins    Review of Systems   Review of Systems  Physical Exam Updated Vital Signs BP 114/84   Pulse 89   Temp 97.7 F (36.5 C) (Oral)   Resp 18   Ht 5\' 5"  (1.651 m)   Wt 92.5 kg   SpO2 96%   BMI 33.93 kg/m  Physical Exam  ED Results / Procedures / Treatments   Labs (all labs ordered are listed, but only abnormal results are displayed) Labs Reviewed - No data to display  EKG None  Radiology US Venous Img Lower Unilateral Left (DVT) Result Date: 02/11/2023 CLINICAL DATA:  Leg swelling. EXAM: LEFT LOWER EXTREMITY VENOUS DOPPLER ULTRASOUND TECHNIQUE: Gray-scale sonography with compression, as well as color and duplex ultrasound, were performed to evaluate the deep venous system(s) from the level of the common  femoral vein through the popliteal and proximal calf veins. COMPARISON:  None Available. FINDINGS: VENOUS Normal compressibility of the common femoral, superficial femoral, and popliteal veins, as well as the visualized calf veins. Visualized portions of profunda femoral vein and great saphenous vein unremarkable. No filling defects to suggest DVT on grayscale or color Doppler imaging. Doppler waveforms show normal direction of venous flow, normal respiratory plasticity and response to augmentation. Limited views of the contralateral common femoral vein are unremarkable. OTHER Note is made of a small amount of complex fluid anterior to the left knee joint. Limitations: none IMPRESSION: *No femoropopliteal DVT nor evidence of DVT within the visualized calf veins. If clinical symptoms are inconsistent or if there are persistent or worsening symptoms, further imaging (possibly involving the iliac veins) may be warranted. *Small knee joint effusion. Electronically Signed   By: Jules Schick M.D.   On: 02/11/2023 15:17    Procedures Procedures    Medications Ordered in ED Medications - No data to display  ED Course/ Medical Decision Making/ A&P Clinical Course as of 02/11/23 1523  Sat Feb 11, 2023  1522 US Venous Img Lower Unilateral Left (DVT) MPRESSION: *No femoropopliteal DVT nor evidence of DVT within the visualized calf veins. If clinical symptoms are inconsistent or if there are persistent or worsening symptoms, further imaging (possibly involving the iliac  veins) may be warranted. *Small knee joint effusion.   [TY]    Clinical Course User Index [TY] Coral Spikes, DO                                 Medical Decision Making 49 year old male presenting emergency department for evaluation of possible DVT in his left lower extremity.  He is afebrile nontachycardic hemodynamic stable.  On exam does have some swelling on his left as compared to his right, but equal pulses normal sensation,  soft compartments.  He was sent by his orthopedic office for evaluation of DVT.  Feel this is reasonable given his recent surgery and swelling.  Ultrasound today however is negative.  He does note that symptoms seemingly started after he went to therapy the other day.  Perhaps symptoms are secondary to MSK?[name]  Will have him follow-up with Ortho and primary doctor.  Stable for discharge.  Amount and/or Complexity of Data Reviewed Radiology:  Decision-making details documented in ED Course.          Final Clinical Impression(s) / ED Diagnoses Final diagnoses:  None    Rx / DC Orders ED Discharge Orders     None         Coral Spikes, DO 02/11/23 1523

## 2023-02-11 NOTE — ED Notes (Signed)
Transported to ultrasound

## 2023-02-11 NOTE — ED Notes (Signed)
Pt is stated left leg pain is more noticeable when moving around. Stated recently had surgery and after physical therapy Thursday noticed pain. Left knee appears slightly larger than right. Denies redness or any increased warmth to leg. Pt is able to move foot and toes freely bilaterally.

## 2023-02-11 NOTE — Discharge Instructions (Signed)
You do not have a blood clot in your leg. Please follow-up with your primary doctor and your orthopedic doctor.

## 2023-02-13 ENCOUNTER — Ambulatory Visit (HOSPITAL_COMMUNITY): Payer: Medicare Other

## 2023-02-13 DIAGNOSIS — M6281 Muscle weakness (generalized): Secondary | ICD-10-CM

## 2023-02-13 DIAGNOSIS — R262 Difficulty in walking, not elsewhere classified: Secondary | ICD-10-CM
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
## 2023-02-15 ENCOUNTER — Ambulatory Visit (HOSPITAL_COMMUNITY): Payer: Medicare Other

## 2023-02-15 DIAGNOSIS — M25562 Pain in left knee: Secondary | ICD-10-CM

## 2023-02-15 DIAGNOSIS — R262 Difficulty in walking, not elsewhere classified: Secondary | ICD-10-CM | POA: Diagnosis not present

## 2023-02-15 DIAGNOSIS — M6281 Muscle weakness (generalized): Secondary | ICD-10-CM
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
## 2023-02-17 ENCOUNTER — Ambulatory Visit (HOSPITAL_COMMUNITY): Payer: Medicare Other

## 2023-02-17 DIAGNOSIS — R262 Difficulty in walking, not elsewhere classified: Secondary | ICD-10-CM | POA: Diagnosis not present

## 2023-02-17 DIAGNOSIS — M25562 Pain in left knee: Secondary | ICD-10-CM

## 2023-02-17 DIAGNOSIS — M6281 Muscle weakness (generalized): Secondary | ICD-10-CM
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
## 2023-02-20 ENCOUNTER — Ambulatory Visit (HOSPITAL_COMMUNITY): Payer: Medicare Other | Attending: Surgical | Admitting: Physical Therapy

## 2023-02-20 DIAGNOSIS — M25562 Pain in left knee: Secondary | ICD-10-CM | POA: Insufficient documentation

## 2023-02-20 DIAGNOSIS — M6281 Muscle weakness (generalized): Secondary | ICD-10-CM | POA: Diagnosis present

## 2023-02-20 DIAGNOSIS — R262 Difficulty in walking, not elsewhere classified: Secondary | ICD-10-CM | POA: Insufficient documentation
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
## 2023-02-22 ENCOUNTER — Encounter (HOSPITAL_COMMUNITY): Payer: Medicare Other | Admitting: Physical Therapy

## 2023-02-22 ENCOUNTER — Ambulatory Visit (HOSPITAL_COMMUNITY): Payer: Medicare Other | Admitting: Physical Therapy

## 2023-02-22 DIAGNOSIS — R262 Difficulty in walking, not elsewhere classified: Secondary | ICD-10-CM

## 2023-02-22 DIAGNOSIS — M25562 Pain in left knee: Secondary | ICD-10-CM

## 2023-02-22 DIAGNOSIS — M6281 Muscle weakness (generalized): Secondary | ICD-10-CM
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
## 2023-02-24 ENCOUNTER — Encounter (HOSPITAL_COMMUNITY): Payer: Medicare Other

## 2023-02-27 ENCOUNTER — Ambulatory Visit (HOSPITAL_COMMUNITY): Payer: Medicare Other

## 2023-02-27 DIAGNOSIS — R262 Difficulty in walking, not elsewhere classified: Secondary | ICD-10-CM | POA: Diagnosis not present

## 2023-02-27 DIAGNOSIS — M6281 Muscle weakness (generalized): Secondary | ICD-10-CM
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
## 2023-03-01 ENCOUNTER — Ambulatory Visit (INDEPENDENT_AMBULATORY_CARE_PROVIDER_SITE_OTHER): Payer: Medicare Other | Admitting: Surgical

## 2023-03-01 ENCOUNTER — Encounter (HOSPITAL_COMMUNITY): Payer: Medicare Other | Admitting: Physical Therapy

## 2023-03-01 ENCOUNTER — Encounter: Payer: Self-pay | Admitting: Surgical

## 2023-03-01 DIAGNOSIS — Z9889 Other specified postprocedural states: Secondary | ICD-10-CM
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
## 2023-03-03 ENCOUNTER — Encounter (HOSPITAL_COMMUNITY): Payer: Self-pay

## 2023-03-03 ENCOUNTER — Ambulatory Visit (HOSPITAL_COMMUNITY): Payer: Medicare Other

## 2023-03-03 DIAGNOSIS — R262 Difficulty in walking, not elsewhere classified: Secondary | ICD-10-CM

## 2023-03-03 DIAGNOSIS — M6281 Muscle weakness (generalized): Secondary | ICD-10-CM
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
## 2023-03-06 ENCOUNTER — Encounter (HOSPITAL_COMMUNITY): Payer: Medicare Other

## 2023-03-08 ENCOUNTER — Ambulatory Visit (HOSPITAL_COMMUNITY): Payer: Medicare Other | Admitting: Physical Therapy

## 2023-03-08 DIAGNOSIS — M25562 Pain in left knee: Secondary | ICD-10-CM

## 2023-03-08 DIAGNOSIS — M6281 Muscle weakness (generalized): Secondary | ICD-10-CM

## 2023-03-08 DIAGNOSIS — R262 Difficulty in walking, not elsewhere classified: Secondary | ICD-10-CM
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
## 2023-03-10 ENCOUNTER — Ambulatory Visit (HOSPITAL_COMMUNITY): Payer: Medicare Other

## 2023-03-10 ENCOUNTER — Encounter (HOSPITAL_COMMUNITY): Payer: Self-pay

## 2023-03-10 DIAGNOSIS — M25562 Pain in left knee: Secondary | ICD-10-CM

## 2023-03-10 DIAGNOSIS — R262 Difficulty in walking, not elsewhere classified: Secondary | ICD-10-CM

## 2023-03-10 DIAGNOSIS — M6281 Muscle weakness (generalized): Secondary | ICD-10-CM
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
## 2023-03-13 ENCOUNTER — Encounter (HOSPITAL_COMMUNITY): Payer: Medicare Other

## 2023-03-15 ENCOUNTER — Encounter (HOSPITAL_COMMUNITY): Payer: Medicare Other | Admitting: Physical Therapy

## 2023-03-17 ENCOUNTER — Encounter (HOSPITAL_COMMUNITY): Payer: Self-pay

## 2023-03-17 ENCOUNTER — Telehealth: Payer: Self-pay | Admitting: Orthopedic Surgery

## 2023-03-17 ENCOUNTER — Ambulatory Visit (HOSPITAL_COMMUNITY): Payer: Medicare Other

## 2023-03-17 DIAGNOSIS — R262 Difficulty in walking, not elsewhere classified: Secondary | ICD-10-CM

## 2023-03-17 DIAGNOSIS — M25562 Pain in left knee: Secondary | ICD-10-CM

## 2023-03-17 DIAGNOSIS — M6281 Muscle weakness (generalized): Secondary | ICD-10-CM

## 2023-03-17 NOTE — Telephone Encounter (Signed)
 Patient had a injury and fell on the left knee. Pt wants an appointment ASAP in the morning
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
## 2023-03-20 ENCOUNTER — Encounter (HOSPITAL_COMMUNITY): Payer: Medicare Other | Admitting: Physical Therapy

## 2023-03-20 ENCOUNTER — Telehealth (HOSPITAL_COMMUNITY): Payer: Self-pay | Admitting: Physical Therapy

## 2023-03-20 NOTE — Telephone Encounter (Signed)
Work in appt scheduled

## 2023-03-20 NOTE — Telephone Encounter (Signed)
 Pt did not show for appt.  Called and spoke to pt who states he overslept. Reminded of next appt in which he states he will be here.   Lurena Nida, PTA/CLT [facility] Ph: [phone]

## 2023-03-22 ENCOUNTER — Other Ambulatory Visit

## 2023-03-22 ENCOUNTER — Ambulatory Visit (HOSPITAL_COMMUNITY): Payer: Medicare Other | Attending: Orthopedic Surgery | Admitting: Physical Therapy

## 2023-03-22 ENCOUNTER — Ambulatory Visit (INDEPENDENT_AMBULATORY_CARE_PROVIDER_SITE_OTHER): Admitting: Orthopedic Surgery

## 2023-03-22 DIAGNOSIS — M25562 Pain in left knee: Secondary | ICD-10-CM

## 2023-03-22 DIAGNOSIS — M6281 Muscle weakness (generalized): Secondary | ICD-10-CM | POA: Diagnosis not present

## 2023-03-22 DIAGNOSIS — R262 Difficulty in walking, not elsewhere classified: Secondary | ICD-10-CM | POA: Diagnosis not present
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
## 2023-03-23 ENCOUNTER — Encounter: Payer: Self-pay | Admitting: Orthopedic Surgery
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
## 2023-03-24 ENCOUNTER — Encounter (HOSPITAL_COMMUNITY): Payer: Medicare Other

## 2023-03-29 ENCOUNTER — Encounter: Payer: Medicare Other | Admitting: Surgical

## 2023-04-05 ENCOUNTER — Encounter (HOSPITAL_COMMUNITY): Payer: Self-pay

## 2023-04-05 NOTE — Therapy (Signed)
 [facility] Out[facility] Rehabilitation at [facility] [address] [place], [place], [address] Phone: [phone]   Fax:  [phone]  Patient Details  Name: Craig [name] MRN: [id number] Date of Birth: Dec 20, 1975 Referring Provider:  No ref. provider found  Encounter Date: 04/05/2023  PHYSICAL THERAPY DISCHARGE SUMMARY  Visits from Start of Care: 13  Current functional level related to goals / functional outcomes: Did meet all goals.   Remaining deficits: ROM MMT Abnormal gait pattern   Education / Equipment: See last treatment note for details.    Patient agrees to discharge. Patient goals were met. Patient is being discharged due to being pleased with the current functional level. Pt desired to discharge from PT as he needed to return back to work.    Nelida Meuse, PT 04/05/2023, 8:03 AM  [facility] Outpatient Rehabilitation at [facility] [address] [place], [place], [address] Phone: [phone]   Fax:  [phone]

## 2023-04-18 ENCOUNTER — Telehealth: Payer: Self-pay | Admitting: Orthopedic Surgery

## 2023-04-18 NOTE — Telephone Encounter (Signed)
 Pt called requesting medication for inflammation. He states legs are still swelling. Please send to [facility]. Pt phone number is [phone].

## 2023-04-19 ENCOUNTER — Other Ambulatory Visit: Payer: Self-pay | Admitting: Surgical

## 2023-04-19 MED ORDER — MELOXICAM 15 MG PO TABS: 15.0000 mg | ORAL_TABLET | Freq: Every day | ORAL | 2 refills | Status: DC

## 2023-04-19 NOTE — Telephone Encounter (Signed)
 Called and advised. Also advised to rest,elevate and try a compression ace wrap

## 2023-04-19 NOTE — Telephone Encounter (Signed)
Sent in mobic to pharmacy

## 2023-04-24 ENCOUNTER — Encounter: Admitting: Surgical

## 2023-07-11 DIAGNOSIS — R5383 Other fatigue: Secondary | ICD-10-CM | POA: Diagnosis not present

## 2023-07-11 DIAGNOSIS — Z299 Encounter for prophylactic measures, unspecified: Secondary | ICD-10-CM | POA: Diagnosis not present

## 2023-07-11 DIAGNOSIS — F17210 Nicotine dependence, cigarettes, uncomplicated: Secondary | ICD-10-CM | POA: Diagnosis not present

## 2023-07-11 DIAGNOSIS — I10 Essential (primary) hypertension: Secondary | ICD-10-CM | POA: Diagnosis not present

## 2023-07-11 DIAGNOSIS — M25552 Pain in left hip: Secondary | ICD-10-CM | POA: Diagnosis not present

## 2023-08-04 DIAGNOSIS — R5383 Other fatigue: Secondary | ICD-10-CM | POA: Diagnosis not present

## 2023-08-04 DIAGNOSIS — F17210 Nicotine dependence, cigarettes, uncomplicated: Secondary | ICD-10-CM | POA: Diagnosis not present

## 2023-08-04 DIAGNOSIS — Z0000 Encounter for general adult medical examination without abnormal findings: Secondary | ICD-10-CM | POA: Diagnosis not present

## 2023-08-04 DIAGNOSIS — Z713 Dietary counseling and surveillance: Secondary | ICD-10-CM | POA: Diagnosis not present

## 2023-08-04 DIAGNOSIS — Z7189 Other specified counseling: Secondary | ICD-10-CM | POA: Diagnosis not present

## 2023-08-04 DIAGNOSIS — Z299 Encounter for prophylactic measures, unspecified: Secondary | ICD-10-CM | POA: Diagnosis not present

## 2023-08-14 ENCOUNTER — Emergency Department (HOSPITAL_COMMUNITY)

## 2023-08-14 ENCOUNTER — Encounter (HOSPITAL_COMMUNITY): Payer: Self-pay

## 2023-08-14 ENCOUNTER — Other Ambulatory Visit: Payer: Self-pay

## 2023-08-14 ENCOUNTER — Emergency Department (HOSPITAL_COMMUNITY)
Admission: EM | Admit: 2023-08-14 | Discharge: 2023-08-14 | Disposition: A | Discharge status: Home / Self Care | Attending: Emergency Medicine | Admitting: Emergency Medicine

## 2023-08-14 DIAGNOSIS — R0789 Other chest pain: Secondary | ICD-10-CM

## 2023-08-14 DIAGNOSIS — I517 Cardiomegaly: Secondary | ICD-10-CM | POA: Diagnosis not present

## 2023-08-14 DIAGNOSIS — R079 Chest pain, unspecified: Secondary | ICD-10-CM | POA: Insufficient documentation

## 2023-08-14 DIAGNOSIS — J849 Interstitial pulmonary disease, unspecified: Secondary | ICD-10-CM

## 2023-08-14 DIAGNOSIS — R918 Other nonspecific abnormal finding of lung field: Secondary | ICD-10-CM | POA: Diagnosis not present

## 2023-08-14 LAB — CBC
HCT: 46.8 % (ref 39.0–52.0)
Hemoglobin: 15.7 g/dL (ref 13.0–17.0)
MCH: 31.5 pg (ref 26.0–34.0)
MCHC: 33.5 g/dL (ref 30.0–36.0)
MCV: 93.8 fL (ref 80.0–100.0)
Platelets: 251 K/uL (ref 150–400)
RBC: 4.99 MIL/uL (ref 4.22–5.81)
RDW: 14 % (ref 11.5–15.5)
WBC: 10.5 K/uL (ref 4.0–10.5)
nRBC: 0 % (ref 0.0–0.2)

## 2023-08-14 LAB — TROPONIN I (HIGH SENSITIVITY)
Troponin I (High Sensitivity): <2 ng/L (ref <18)
Troponin I (High Sensitivity): 2 ng/L (ref <18)

## 2023-08-14 LAB — BASIC METABOLIC PANEL WITH GFR
Anion gap: 7 (ref 5–15)
BUN: 11 mg/dL (ref 6–20)
CO2: 21 mmol/L — ABNORMAL LOW (ref 22–32)
Calcium: 9.1 mg/dL (ref 8.9–10.3)
Chloride: 108 mmol/L (ref 98–111)
Creatinine, Ser: 0.76 mg/dL (ref 0.61–1.24)
GFR, Estimated: >60 mL/min (ref >60)
Glucose, Bld: 125 mg/dL — ABNORMAL HIGH (ref 70–99)
Potassium: 3.5 mmol/L (ref 3.5–5.1)
Sodium: 136 mmol/L (ref 135–145)

## 2023-08-14 MED ORDER — KETOROLAC TROMETHAMINE 15 MG/ML IJ SOLN
15.0000 mg | Freq: Once | INTRAMUSCULAR | Status: AC
  Administered 2023-08-14: 15 mg via INTRAVENOUS
  Filled 2023-08-14: qty 1

## 2023-08-14 MED ORDER — IOHEXOL 350 MG/ML SOLN
75.0000 mL | Freq: Once | INTRAVENOUS | Status: AC | PRN
  Administered 2023-08-14: 75 mL via INTRAVENOUS

## 2023-08-14 MED ORDER — NAPROXEN 500 MG PO TABS: 500.0000 mg | ORAL_TABLET | Freq: Two times a day (BID) | ORAL | 0 refills | Status: AC

## 2023-08-14 NOTE — ED Provider Notes (Cosign Needed Addendum)
 [facility] AT [facility] Provider Note   CSN: [id number] Arrival date & time: 08/14/23  9187     Patient presents with: Chest Pain   Craig [name] is a 48 y.o. male.  He presents ER for evaluation of chest pain ongoing for the past 4 days.  It is intermittent, started while at rest, and feels like something is a beating the tar out of my chest. He states that the pain is intermittent, but not associated with exertion, or position.  The pain does seem to radiate into the right neck when it occurs.  Denies diaphoresis nausea, shortness of breath with this.  Denies pain like this in the past.  Pain is not associated with eating he denies abdominal pain nausea or vomiting. No lower extremity swelling or pain, no recent surgeries or immobilization.  Denies worsening symptoms after eating, denies radiation of the pain in the back.    Chest Pain Associated symptoms: no diaphoresis, no dizziness, no nausea, no palpitations and no shortness of breath        Prior to Admission medications   Medication Sig Start Date End Date Taking? Authorizing Provider  meloxicam  (MOBIC ) 15 MG tablet Take 1 tablet (15 mg total) by mouth daily. 04/19/23 04/18/24  Magnant, Carlin CROME, PA-C  methocarbamol  (ROBAXIN ) 500 MG tablet Take 1 tablet (500 mg total) by mouth every 8 (eight) hours as needed for muscle spasms. Patient not taking: Reported on 03/01/2023 01/24/23   Magnant, Carlin CROME, PA-C  oxyCODONE  (ROXICODONE ) 5 MG immediate release tablet Take 1 tablet (5 mg total) by mouth every 4 (four) hours as needed for severe pain (pain score 7-10). Patient not taking: Reported on 03/01/2023 01/24/23   Magnant, Carlin CROME, PA-C    Allergies: Penicillins    Review of Systems  Constitutional:  Negative for diaphoresis.  Respiratory:  Negative for shortness of breath.   Cardiovascular:  Positive for chest pain. Negative for palpitations and leg swelling.  Gastrointestinal:  Negative for  nausea.  Neurological:  Negative for dizziness and syncope.    Updated Vital Signs There were no vitals taken for this visit.  Physical Exam Vitals and nursing note reviewed.  Constitutional:      General: He is not in acute distress.    Appearance: He is well-developed.  HENT:     Head: Normocephalic and atraumatic.  Eyes:     Extraocular Movements: Extraocular movements intact.     Conjunctiva/sclera: Conjunctivae normal.     Pupils: Pupils are equal, round, and reactive to light.  Cardiovascular:     Rate and Rhythm: Normal rate and regular rhythm.     Heart sounds: No murmur heard. Pulmonary:     Effort: Pulmonary effort is normal. No respiratory distress.     Breath sounds: Normal breath sounds.     Comments: Scattered rhonchi bilaterally Chest:    Abdominal:     Palpations: Abdomen is soft.     Tenderness: There is no abdominal tenderness.  Musculoskeletal:        General: No swelling.     Cervical back: Neck supple.     Right lower leg: No tenderness. No edema.     Left lower leg: No tenderness. No edema.  Skin:    General: Skin is warm and dry.     Capillary Refill: Capillary refill takes less than 2 seconds.  Neurological:     General: No focal deficit present.     Mental Status: He is  alert and oriented to person, place, and time.  Psychiatric:        Mood and Affect: Mood normal.     (all labs ordered are listed, but only abnormal results are displayed) Labs Reviewed - No data to display  EKG: None  Radiology: No results found.   Procedures   Medications Ordered in the ED - No data to display                                  Medical Decision Making The patient presented today for chest pain that had started days ago and has been intermittent. EKG showed intraventricular conduction delay, Chest Xray was independently reviewed by me and shows right lower lobe infiltrate. I agree with radiology interpretation.   They were not having pain  while in the emergency department, will give Toradol  however since the pain has been coming back.  I considered a broad differential including but not limited to ACS, PE, Dissection, pneumothorax, costochondritis, pneumonia, GERD, pericarditis, and pericardial effusion.  PE is considered, CTA ordered to rule this out given patient has oxygen  but between 90 and 94% and had been very mildly tachypneic with respiratory rate of 22.  This is negative for PE but did show progression of mild interstitial lung disease  I feel disssection is very unlikely  Symptoms and EKG are not consistent with pericarditis  Their heart score is 4 (moderate risk). Troponin it is less than 2 and subsequently 2 on redraw, ruling out ACS  Plan is for discharge home, follow-up with cardiology and will put in referral for this as he does have moderate risk heart score.  Will also follow with pulmonology for the interstitial lung disease noted on CT.  Discussed smoking cessation with the patient and follow-up with his PCP to see if they can prescribe medications to help him quit smoking.  I discussed that his pain is intermittent, he had some mildly reproducible pain in the right upper chest so we will start on NSAIDs to see if this helps with his symptoms as these are not convincingly cardiac in nature.  He is given strict return precautions.   Amount and/or Complexity of Data Reviewed Labs: ordered. Radiology: ordered.  Risk Prescription drug management.        Final diagnoses:  None    ED Discharge Orders     None          Suellen Sherran LABOR, PA-C 08/14/23 [address], PA-C 08/14/23 1236    Elnor Jayson LABOR, DO 08/15/23 1047

## 2023-08-14 NOTE — Discharge Instructions (Signed)
 You are seen in the ER today for chest pain.  Fortunately there is no sign of heart attack, blood clot in your lung, pneumonia or other emergent cause of your chest pain.  It is important for you to follow-up with heart doctor.  Your CT scan also showed signs of likely interstitial lung disease and you need to follow-up with the lung specialist as your oxygen  is on the lower side of normal.  Develop worsening symptoms, shortness of breath, dizziness, or other worrisome changes come back to the ER right away.  I have placed referrals for the specialists, you to also follow-up with your PCP.  I am prescribing naproxen  to help with your pain.  If you do not hear from the specialist in the next few days schedule an appointment, please make sure you call them to schedule appointment.

## 2023-08-14 NOTE — ED Triage Notes (Signed)
 Pt c/o mid chest pain starting four days ago aching. Pt denies pain radiating anywhere. Pt states happened all of sudden was not doing any strenuous activity.

## 2023-09-21 ENCOUNTER — Encounter: Payer: Self-pay | Admitting: Pulmonary Disease

## 2023-09-21 ENCOUNTER — Ambulatory Visit: Admitting: Pulmonary Disease

## 2023-09-21 VITALS — BP 129/75 | HR 77 | Ht 65.0 in | Wt 199.0 lb

## 2023-09-21 DIAGNOSIS — F17210 Nicotine dependence, cigarettes, uncomplicated: Secondary | ICD-10-CM

## 2023-09-21 DIAGNOSIS — F17200 Nicotine dependence, unspecified, uncomplicated: Secondary | ICD-10-CM

## 2023-09-21 DIAGNOSIS — J449 Chronic obstructive pulmonary disease, unspecified: Secondary | ICD-10-CM

## 2023-09-21 DIAGNOSIS — J849 Interstitial pulmonary disease, unspecified: Secondary | ICD-10-CM | POA: Diagnosis not present

## 2023-09-21 MED ORDER — STIOLTO RESPIMAT 2.5-2.5 MCG/ACT IN AERS: 2.0000 | INHALATION_SPRAY | Freq: Every day | RESPIRATORY_TRACT | Status: DC

## 2023-09-21 MED ORDER — NICOTINE 21 MG/24HR TD PT24: MEDICATED_PATCH | TRANSDERMAL | 0 refills | Status: AC

## 2023-09-21 MED ORDER — NICOTINE 7 MG/24HR TD PT24: MEDICATED_PATCH | TRANSDERMAL | 0 refills | Status: AC

## 2023-09-21 MED ORDER — NICOTINE POLACRILEX 4 MG MT LOZG: LOZENGE | OROMUCOSAL | 0 refills | Status: AC

## 2023-09-21 MED ORDER — STIOLTO RESPIMAT 2.5-2.5 MCG/ACT IN AERS: 2.0000 | INHALATION_SPRAY | Freq: Every day | RESPIRATORY_TRACT | 6 refills | Status: DC

## 2023-09-21 MED ORDER — IPRATROPIUM-ALBUTEROL 0.5-2.5 (3) MG/3ML IN SOLN: 3.0000 mL | Freq: Four times a day (QID) | RESPIRATORY_TRACT | 6 refills | Status: AC | PRN

## 2023-09-21 MED ORDER — ALBUTEROL SULFATE HFA 108 (90 BASE) MCG/ACT IN AERS: 2.0000 | INHALATION_SPRAY | Freq: Four times a day (QID) | RESPIRATORY_TRACT | 6 refills | Status: AC | PRN

## 2023-09-21 MED ORDER — NICOTINE 14 MG/24HR TD PT24: MEDICATED_PATCH | TRANSDERMAL | 0 refills | Status: AC
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
## 2023-09-21 NOTE — Patient Instructions (Signed)
-   Get CT Chest and breathing test done - Start Stiolto inhaler 2 puffs in the AM - Start using albuterol  inhaler 2 puffs every 6 hours as needed - I ordered a nebulizer machine and medicaitons. - can use it every 6 hours as needed - 1 patch a day - 1 lozenge every 2 hours as needed whenever craving cigarettes - Can call [phone] NOW to get nicotine  lozenges or patches

## 2023-09-23 ENCOUNTER — Emergency Department (HOSPITAL_COMMUNITY)
Admission: EM | Admit: 2023-09-23 | Discharge: 2023-09-23 | Disposition: A | Discharge status: Home / Self Care | Attending: Emergency Medicine | Admitting: Emergency Medicine

## 2023-09-23 ENCOUNTER — Emergency Department (HOSPITAL_COMMUNITY)

## 2023-09-23 ENCOUNTER — Other Ambulatory Visit: Payer: Self-pay

## 2023-09-23 DIAGNOSIS — M779 Enthesopathy, unspecified: Secondary | ICD-10-CM | POA: Diagnosis not present

## 2023-09-23 DIAGNOSIS — M67833 Other specified disorders of tendon, right wrist: Secondary | ICD-10-CM | POA: Diagnosis not present

## 2023-09-23 DIAGNOSIS — M19042 Primary osteoarthritis, left hand: Secondary | ICD-10-CM | POA: Diagnosis not present

## 2023-09-23 DIAGNOSIS — M778 Other enthesopathies, not elsewhere classified: Secondary | ICD-10-CM | POA: Diagnosis not present

## 2023-09-23 DIAGNOSIS — M79642 Pain in left hand: Secondary | ICD-10-CM | POA: Diagnosis present

## 2023-09-23 DIAGNOSIS — M7989 Other specified soft tissue disorders: Secondary | ICD-10-CM | POA: Diagnosis not present

## 2023-09-23 DIAGNOSIS — M25532 Pain in left wrist: Secondary | ICD-10-CM | POA: Diagnosis not present

## 2023-09-23 NOTE — ED Triage Notes (Signed)
 Pt c/o left wrist pain x2 days. Denies any injury

## 2023-09-23 NOTE — ED Provider Notes (Signed)
 [place] EMERGENCY DEPARTMENT AT [facility] Provider Note   CSN: [id number] Arrival date & time: 09/23/23  2004     Patient presents with: Hand Pain   Craig [name] is a 48 y.o. male.   Patient is concerned that he has a broken bone in his left hand.  Patient states he does not recall any injury but had similar pain when he broke bones in his hand in the past.  Patient reports he does do repetitive work with his right hand but does not use his left hand for any repetitive work.  Patient complains of some swelling in his wrist.  Patient denies any other complaints.  He has not had any recent falls.  He does not recall having any blows to his hand.  The history is provided by the patient. No language interpreter was used.  Hand Pain       Prior to Admission medications   Medication Sig Start Date End Date Taking? Authorizing Provider  albuterol  (VENTOLIN  HFA) 108 (90 Base) MCG/ACT inhaler Inhale 2 puffs into the lungs every 6 (six) hours as needed for wheezing or shortness of breath. 09/21/23   Alghanim, Paula, MD  ipratropium-albuterol  (DUONEB) 0.5-2.5 (3) MG/3ML SOLN Take 3 mLs by nebulization every 6 (six) hours as needed. 09/21/23   Alghanim, Paula, MD  methocarbamol  (ROBAXIN ) 500 MG tablet Take 1 tablet (500 mg total) by mouth every 8 (eight) hours as needed for muscle spasms. Patient not taking: Reported on 09/21/2023 01/24/23   Magnant, Carlin CROME, PA-C  naproxen  (NAPROSYN ) 500 MG tablet Take 1 tablet (500 mg total) by mouth 2 (two) times daily. Patient not taking: Reported on 09/21/2023 08/14/23   Suellen Cantor A, PA-C  nicotine  (NICODERM CQ  - DOSED IN MG/24 HOURS) 14 mg/24hr patch RX #2 Weeks 5-6: 14 mg x 1 patch daily. Wear for 24 hours. If you have sleep disturbances, remove at bedtime. 09/21/23   Alghanim, Fahid, MD  nicotine  (NICODERM CQ  - DOSED IN MG/24 HOURS) 21 mg/24hr patch RX #1 Weeks 1-4: 21 mg x 1 patch daily. Wear for 24 hours. If you have sleep disturbances,  remove at bedtime. 09/21/23   Alghanim, Paula, MD  nicotine  (NICODERM CQ  - DOSED IN MG/24 HR) 7 mg/24hr patch RX #3 Weeks 7-8: 7 mg x 1 patch daily. Wear for 24 hours. If you have sleep disturbances, remove at bedtime. 09/21/23   Alghanim, Paula, MD  nicotine  polacrilex (COMMIT) 4 MG lozenge RX #1 Weeks 1-6: 1 lozenge every 1-2 hours. Use at least 9 lozenges per day for the first 6 weeks. Max 20 lozenges per day. 09/21/23   Alghanim, Fahid, MD  rosuvastatin (CRESTOR) 10 MG tablet Take 10 mg by mouth at bedtime. 08/07/23   [provider]  Tiotropium Bromide-Olodaterol (STIOLTO RESPIMAT ) 2.5-2.5 MCG/ACT AERS Inhale 2 puffs into the lungs daily. 09/21/23   Alghanim, Fahid, MD  Tiotropium Bromide-Olodaterol (STIOLTO RESPIMAT ) 2.5-2.5 MCG/ACT AERS Inhale 2 puffs into the lungs daily. 09/21/23   Alghanim, Fahid, MD    Allergies: Penicillins    Review of Systems  All other systems reviewed and are negative.   Updated Vital Signs BP 121/77 (BP Location: Right Arm)   Pulse 76   Temp 98.8 F (37.1 C)   Resp 19   SpO2 95%   Physical Exam Vitals reviewed.  Constitutional:      Appearance: Normal appearance.  Cardiovascular:     Rate and Rhythm: Normal rate.  Pulmonary:     Effort:  Pulmonary effort is normal.  Musculoskeletal:        General: Swelling and tenderness present.     Comments: Tender swollen left wrist, full range of motion, neurovascular neurosensory intact, no deformity no redness or swelling.  Skin:    General: Skin is warm.  Neurological:     General: No focal deficit present.     Mental Status: He is alert.     (all labs ordered are listed, but only abnormal results are displayed) Labs Reviewed - No data to display  EKG: None  Radiology: DG Wrist Complete Left Result Date: 09/23/2023 CLINICAL DATA:  Left wrist and hand pain, swelling for 2 days EXAM: LEFT WRIST - COMPLETE 3+ VIEW; LEFT HAND - COMPLETE 3+ VIEW COMPARISON:  None Available. FINDINGS: Left hand:  Frontal, oblique, and lateral views are obtained. No acute fracture, subluxation, or dislocation. Mild multifocal osteoarthritis greatest at the base of the thumb and distal interphalangeal joints. Soft tissues are unremarkable. Left wrist: Frontal, oblique, lateral, and ulnar deviated views of the left wrist are obtained. No acute displaced fracture, subluxation, or dislocation. Joint spaces are well preserved. Soft tissues are unremarkable. IMPRESSION: 1. No acute displaced fractures. 2. Mild osteoarthritis throughout the left hand. Electronically Signed   By: Ozell Daring M.D.   On: 09/23/2023 21:12   DG Hand Complete Left Result Date: 09/23/2023 CLINICAL DATA:  Left wrist and hand pain, swelling for 2 days EXAM: LEFT WRIST - COMPLETE 3+ VIEW; LEFT HAND - COMPLETE 3+ VIEW COMPARISON:  None Available. FINDINGS: Left hand: Frontal, oblique, and lateral views are obtained. No acute fracture, subluxation, or dislocation. Mild multifocal osteoarthritis greatest at the base of the thumb and distal interphalangeal joints. Soft tissues are unremarkable. Left wrist: Frontal, oblique, lateral, and ulnar deviated views of the left wrist are obtained. No acute displaced fracture, subluxation, or dislocation. Joint spaces are well preserved. Soft tissues are unremarkable. IMPRESSION: 1. No acute displaced fractures. 2. Mild osteoarthritis throughout the left hand. Electronically Signed   By: Ozell Daring M.D.   On: 09/23/2023 21:12     Procedures   Medications Ordered in the ED - No data to display                                  Medical Decision Making Patient complains of pain in his left wrist  Amount and/or Complexity of Data Reviewed Radiology: ordered and independent interpretation performed. Decision-making details documented in ED Course.    Details: X-ray left wrist and left hand show no evidence of fracture  Risk Risk Details: I counseled patient I suspect he may have a tendinitis.  Patient  placed in a splint.  Patient advised ibuprofen  for discomfort return if any problems        Final diagnoses:  Left wrist tendonitis    ED Discharge Orders     None          Flint Sonny MARLA DEVONNA 09/23/23 [id number]    Cleotilde Rogue, MD 09/24/23 [id number]

## 2023-09-23 NOTE — Discharge Instructions (Signed)
 Try ibuprofen  for discomfort.  Follow up with the Orthopaedist for evaluation if pain persist

## 2023-09-25 ENCOUNTER — Telehealth: Payer: Self-pay | Admitting: *Deleted

## 2023-09-25 ENCOUNTER — Other Ambulatory Visit: Payer: Self-pay

## 2023-09-25 DIAGNOSIS — J849 Interstitial pulmonary disease, unspecified: Secondary | ICD-10-CM

## 2023-09-25 DIAGNOSIS — J449 Chronic obstructive pulmonary disease, unspecified: Secondary | ICD-10-CM

## 2023-09-25 NOTE — Telephone Encounter (Signed)
 Patient has order for nebulizer treatment that was sent to [facility], but would like to get the machine from [place].   Please send over order to [facility].   Best contact is [phone]. Patient would like a call once order is sent to [facility]

## 2023-09-25 NOTE — Telephone Encounter (Signed)
 Spoke with pt and informed him that I resent the neb to the correct dme. Nfn

## 2023-09-30 ENCOUNTER — Ambulatory Visit (HOSPITAL_COMMUNITY): Attending: Pulmonary Disease

## 2023-10-03 ENCOUNTER — Telehealth: Payer: Self-pay | Admitting: Pulmonary Disease

## 2023-10-03 NOTE — Telephone Encounter (Signed)
 Spoke with patient regarding the Sunday 10/15/23 4:00pm CT chest appointment at [facility] time is 3:45 pm---at the Emergency room registration desk for check in---will mail information to patient and he voiced his understanding

## 2023-10-04 ENCOUNTER — Encounter: Payer: Self-pay | Admitting: Orthopedic Surgery

## 2023-10-04 ENCOUNTER — Ambulatory Visit: Admitting: Orthopedic Surgery

## 2023-10-04 DIAGNOSIS — M25562 Pain in left knee: Secondary | ICD-10-CM

## 2023-10-04 DIAGNOSIS — Z9889 Other specified postprocedural states: Secondary | ICD-10-CM
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
## 2023-10-11 ENCOUNTER — Ambulatory Visit (HOSPITAL_COMMUNITY)
Admission: RE | Admit: 2023-10-11 | Discharge: 2023-10-11 | Disposition: A | Discharge status: Home / Self Care | Source: Ambulatory Visit | Attending: Orthopedic Surgery | Admitting: Orthopedic Surgery

## 2023-10-11 ENCOUNTER — Ambulatory Visit: Admitting: Orthopedic Surgery

## 2023-10-11 DIAGNOSIS — M25562 Pain in left knee: Secondary | ICD-10-CM | POA: Insufficient documentation

## 2023-10-11 DIAGNOSIS — M1710 Unilateral primary osteoarthritis, unspecified knee: Secondary | ICD-10-CM | POA: Diagnosis not present

## 2023-10-11 DIAGNOSIS — Z9889 Other specified postprocedural states: Secondary | ICD-10-CM | POA: Insufficient documentation

## 2023-10-12 ENCOUNTER — Ambulatory Visit: Payer: Self-pay | Admitting: Orthopedic Surgery

## 2023-10-12 NOTE — Telephone Encounter (Signed)
-----   Message from KANDICE Glendia Hutchinson sent at 10/12/2023 11:03 AM EDT ----- Pls f/u luke thx ----- Message ----- From: Rebecka Mound Results In Sent: 10/12/2023   8:41 AM EDT To: Cordella Glendia Hutchinson, MD

## 2023-10-12 NOTE — Telephone Encounter (Signed)
 Can you please get patient scheduled with Herlene to review MRI per Dr Sedrick request.

## 2023-10-12 NOTE — Progress Notes (Signed)
 Pls f/u luke thx

## 2023-10-15 ENCOUNTER — Ambulatory Visit (HOSPITAL_COMMUNITY): Attending: Pulmonary Disease

## 2023-10-24 ENCOUNTER — Telehealth: Payer: Self-pay | Admitting: Pulmonary Disease

## 2023-10-24 NOTE — Telephone Encounter (Signed)
 Informed patient his 10/27/23 chest ct has been rescheduled to Saturday 11/11/23 at 4:00pm at [facility] time is 3:45 pm--Emergency Room registration desk for check in---will mail information to patient and he voiced his understanding

## 2023-10-24 NOTE — Telephone Encounter (Signed)
 Spoke with patient regarding the Friday 10/27/23 6:00 pm CT chest appointment at [facility] time is  5:45 pm---at the Emergency Room registration desk for check in---patient voiced his understanding

## 2023-10-27 ENCOUNTER — Ambulatory Visit (HOSPITAL_COMMUNITY)

## 2023-10-30 ENCOUNTER — Ambulatory Visit: Admitting: Surgical

## 2023-11-06 ENCOUNTER — Ambulatory Visit: Attending: Cardiology | Admitting: Cardiology
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
## 2023-11-11 ENCOUNTER — Ambulatory Visit (HOSPITAL_COMMUNITY)

## 2023-11-17 ENCOUNTER — Ambulatory Visit: Admitting: Surgical

## 2023-11-17 DIAGNOSIS — M1712 Unilateral primary osteoarthritis, left knee: Secondary | ICD-10-CM | POA: Diagnosis not present

## 2023-11-17 DIAGNOSIS — M25562 Pain in left knee: Secondary | ICD-10-CM

## 2023-11-17 NOTE — Progress Notes (Signed)
 re

## 2023-11-19 ENCOUNTER — Encounter: Payer: Self-pay | Admitting: Surgical
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
## 2023-11-20 ENCOUNTER — Ambulatory Visit: Attending: Internal Medicine | Admitting: Internal Medicine

## 2023-11-20 NOTE — Progress Notes (Signed)
 Erroneous encounter - please disregard.

## 2023-11-23 ENCOUNTER — Ambulatory Visit (HOSPITAL_COMMUNITY)
Admission: RE | Admit: 2023-11-23 | Discharge: 2023-11-23 | Disposition: A | Discharge status: Home / Self Care | Source: Ambulatory Visit | Attending: Pulmonary Disease | Admitting: Pulmonary Disease

## 2023-11-23 ENCOUNTER — Ambulatory Visit (HOSPITAL_BASED_OUTPATIENT_CLINIC_OR_DEPARTMENT_OTHER): Payer: Self-pay | Admitting: Pulmonary Disease

## 2023-11-23 DIAGNOSIS — J449 Chronic obstructive pulmonary disease, unspecified: Secondary | ICD-10-CM | POA: Insufficient documentation

## 2023-11-23 LAB — PULMONARY FUNCTION TEST
DL/VA % pred: 107 %
DL/VA: 4.9 ml/min/mmHg/L
DLCO unc % pred: 83 %
DLCO unc: 21.1 ml/min/mmHg
FEF 25-75 Post: 1.93 L/s
FEF 25-75 Pre: 1.87 L/s
FEF2575-%Change-Post: 3 %
FEF2575-%Pred-Post: 61 %
FEF2575-%Pred-Pre: 59 %
FEV1-%Change-Post: 2 %
FEV1-%Pred-Post: 75 %
FEV1-%Pred-Pre: 74 %
FEV1-Post: 2.56 L
FEV1-Pre: 2.51 L
FEV1FVC-%Change-Post: 7 %
FEV1FVC-%Pred-Pre: 93 %
FEV6-%Change-Post: -3 %
FEV6-%Pred-Post: 78 %
FEV6-%Pred-Pre: 81 %
FEV6-Post: 3.24 L
FEV6-Pre: 3.37 L
FEV6FVC-%Change-Post: 1 %
FEV6FVC-%Pred-Post: 103 %
FEV6FVC-%Pred-Pre: 102 %
FVC-%Change-Post: -5 %
FVC-%Pred-Post: 75 %
FVC-%Pred-Pre: 79 %
FVC-Post: 3.25 L
FVC-Pre: 3.42 L
Post FEV1/FVC ratio: 79 %
Post FEV6/FVC ratio: 100 %
Pre FEV1/FVC ratio: 73 %
Pre FEV6/FVC Ratio: 99 %
RV % pred: 106 %
RV: 1.84 L
TLC % pred: 87 %
TLC: 5.19 L

## 2023-11-23 LAB — BLOOD GAS, ARTERIAL
Acid-Base Excess: 2.6 mmol/L — ABNORMAL HIGH (ref 0.0–2.0)
Bicarbonate: 26.3 mmol/L (ref 20.0–28.0)
Drawn by: 560031
O2 Saturation: 98.7 %
Patient temperature: 37
pCO2 arterial: 37 mmHg (ref 32–48)
pH, Arterial: 7.46 — ABNORMAL HIGH (ref 7.35–7.45)
pO2, Arterial: 80 mmHg — ABNORMAL LOW (ref 83–108)

## 2023-11-23 MED ORDER — ALBUTEROL SULFATE (2.5 MG/3ML) 0.083% IN NEBU
2.5000 mg | INHALATION_SOLUTION | Freq: Once | RESPIRATORY_TRACT | Status: AC
  Administered 2023-11-23: 2.5 mg via RESPIRATORY_TRACT

## 2023-11-23 NOTE — Progress Notes (Signed)
 RT NOTE:  ABG results as followed:   Latest Reference Range & Units 11/23/23 14:02  pH, Arterial 7.35 - 7.45  7.46 (H)  pCO2 arterial 32 - 48 mmHg 37  pO2, Arterial 83 - 108 mmHg 80 (L)  Acid-Base Excess 0.0 - 2.0 mmol/L 2.6 (H)  Bicarbonate 20.0 - 28.0 mmol/L 26.3  O2 Saturation % 98.7  Patient temperature  37.0  Collection site  RIGHT RADIAL  Allens test (pass/fail) PASS  PASS  (H): Data is abnormally high (L): Data is abnormally low

## 2023-11-28 ENCOUNTER — Ambulatory Visit: Payer: Self-pay | Admitting: Pulmonary Disease

## 2023-11-28 DIAGNOSIS — J449 Chronic obstructive pulmonary disease, unspecified: Secondary | ICD-10-CM

## 2023-11-28 MED ORDER — STIOLTO RESPIMAT 2.5-2.5 MCG/ACT IN AERS
2.0000 | INHALATION_SPRAY | Freq: Every day | RESPIRATORY_TRACT | 6 refills | Status: AC
Start: 2023-11-28 — End: ?

## 2023-12-04 ENCOUNTER — Telehealth: Payer: Self-pay | Admitting: Pulmonary Disease

## 2023-12-04 ENCOUNTER — Ambulatory Visit (HOSPITAL_COMMUNITY)
Admission: RE | Admit: 2023-12-04 | Discharge: 2023-12-04 | Disposition: A | Discharge status: Home / Self Care | Source: Ambulatory Visit | Attending: Pulmonary Disease | Admitting: Pulmonary Disease

## 2023-12-04 DIAGNOSIS — J849 Interstitial pulmonary disease, unspecified: Secondary | ICD-10-CM | POA: Diagnosis present

## 2023-12-04 NOTE — Telephone Encounter (Signed)
 Called to remind patient of his 6:30 pm CT chest appointment today at [facility] time is 6:15 pm at the Emergency Room registration desk for check in---he voiced his understanding

## 2023-12-25 ENCOUNTER — Telehealth: Payer: Self-pay | Admitting: Surgical

## 2023-12-25 NOTE — Telephone Encounter (Signed)
Talked with patient and appt.has been scheduled.

## 2023-12-25 NOTE — Telephone Encounter (Signed)
 Pt called stating he asked for Craig [name] to submit to insurance for bil knee gel injection. Please call pt at [phone].

## 2024-01-04 ENCOUNTER — Ambulatory Visit: Admitting: Surgical

## 2024-01-08 NOTE — Progress Notes (Deleted)
 "  Established Patient Pulmonology Office Visit   Subjective:  Patient ID: Craig [name], male    DOB: 1975-05-31  MRN: [id number]  CC: No chief complaint on file.   HPI  Craig [name] is a 48 y.o. male with HLD and COPD who presents for follow up.  Last seen 09/2023 due to what was thought to be ILD. HRCT, PFTs ordered. Started Stiolto for suspected COPD.    {PULM QUESTIONNAIRES (Optional):33196}  ROS  {History (Optional):23778} Current Medications[1]      Objective:  There were no vitals taken for this visit. {Pulm Vitals (Optional):32837}  Physical Exam   Diagnostic Review:  {Labs (Optional):32838}  I reviewed CTA Chest since 11/11/2017 and compared to CTA Chest 08/14/23. The patient has evidence of mosaic attenuation in the bilateral upper lobes, RML, and lower lobes. This appears to be relatively stable. He also has small multiple pulmonary nodules in upper lobes as well as lower lobes that appear stable over time. No evidence of fibrotic changes, traction bronchiectasis, or honeycombing. This appearance is not typical of UIP, OP, or NSIP.   CTA 08/14/23: IMPRESSION: 1. No pulmonary emboli. 2. Stable cardiomegaly. 3. Mild diffuse accentuation of the interstitial markings with mild progression. This has an appearance compatible with mildly progressive chronic interstitial lung disease. 4. No significant change in 2 sub 5 mm nodules in the right upper lobe. These do not need imaging follow-up   CTA 10/22/2017: Lungs/Pleura: There are areas of mild atelectatic change bilaterally. There is no appreciable edema or consolidation. On axial slice 63 series 6, there is a 2 mm nodular opacity in the posterior segment of the right upper lobe. On axial slice 66 series 6, there is a 2 mm nodular opacity in the posterior segment of the right upper lobe. There is no pleural effusion or pleural thickening evident.  HRCT 12/04/2023: IMPRESSION: 1. No evidence of  interstitial lung disease. Air trapping is indicative of small airways disease. 2. Scattered tiny pulmonary nodules, unchanged. Low-dose CT lung cancer screening is recommended for patients who are 41-23 years of age with a 20+ pack-year history of smoking and who are currently smoking or quit <=15 years ago. 3. Liver appears CT attic and possibly cirrhotic. 4.  Aortic atherosclerosis (ICD10-I70.0). 5.  Emphysema (ICD10-J43.9).  PFTs 11/2023: borderline obstructive lung disease     Assessment & Plan:   Assessment & Plan COPD without exacerbation (HCC)  ILD (interstitial lung disease) (HCC)  Nicotine  dependence with current use   No orders of the defined types were placed in this encounter.     No follow-ups on file.   Ishi Danser, MD     [1]  Current Outpatient Medications:    albuterol  (VENTOLIN  HFA) 108 (90 Base) MCG/ACT inhaler, Inhale 2 puffs into the lungs every 6 (six) hours as needed for wheezing or shortness of breath., Disp: 8.5 g, Rfl: 6   ipratropium-albuterol  (DUONEB) 0.5-2.5 (3) MG/3ML SOLN, Take 3 mLs by nebulization every 6 (six) hours as needed., Disp: 360 mL, Rfl: 6   methocarbamol  (ROBAXIN ) 500 MG tablet, Take 1 tablet (500 mg total) by mouth every 8 (eight) hours as needed for muscle spasms. (Patient not taking: Reported on 09/21/2023), Disp: 30 tablet, Rfl: 1   naproxen  (NAPROSYN ) 500 MG tablet, Take 1 tablet (500 mg total) by mouth 2 (two) times daily. (Patient not taking: Reported on 09/21/2023), Disp: 10 tablet, Rfl: 0   nicotine  (NICODERM CQ  - DOSED IN MG/24 HOURS) 14 mg/24hr patch, RX #2  Weeks 5-6: 14 mg x 1 patch daily. Wear for 24 hours. If you have sleep disturbances, remove at bedtime., Disp: 14 patch, Rfl: 0   nicotine  (NICODERM CQ  - DOSED IN MG/24 HOURS) 21 mg/24hr patch, RX #1 Weeks 1-4: 21 mg x 1 patch daily. Wear for 24 hours. If you have sleep disturbances, remove at bedtime., Disp: 28 patch, Rfl: 0   nicotine  (NICODERM CQ  - DOSED IN MG/24  HR) 7 mg/24hr patch, RX #3 Weeks 7-8: 7 mg x 1 patch daily. Wear for 24 hours. If you have sleep disturbances, remove at bedtime., Disp: 14 patch, Rfl: 0   nicotine  polacrilex (COMMIT) 4 MG lozenge, RX #1 Weeks 1-6: 1 lozenge every 1-2 hours. Use at least 9 lozenges per day for the first 6 weeks. Max 20 lozenges per day., Disp: 1008 lozenge, Rfl: 0   rosuvastatin (CRESTOR) 10 MG tablet, Take 10 mg by mouth at bedtime., Disp: , Rfl:    Tiotropium Bromide-Olodaterol (STIOLTO RESPIMAT ) 2.5-2.5 MCG/ACT AERS, Inhale 2 puffs into the lungs daily., Disp: 4 g, Rfl: 6  "

## 2024-01-09 ENCOUNTER — Encounter: Payer: Self-pay | Admitting: Pulmonary Disease

## 2024-01-09 ENCOUNTER — Ambulatory Visit: Admitting: Pulmonary Disease

## 2024-01-09 DIAGNOSIS — J849 Interstitial pulmonary disease, unspecified: Secondary | ICD-10-CM

## 2024-01-09 DIAGNOSIS — J449 Chronic obstructive pulmonary disease, unspecified: Secondary | ICD-10-CM

## 2024-01-09 DIAGNOSIS — F17200 Nicotine dependence, unspecified, uncomplicated: Secondary | ICD-10-CM
